# Patient Record
Sex: Female | Born: 1960 | Race: White | Hispanic: No | Smoking: Current every day smoker
Health system: Southern US, Community
[De-identification: ages and names within clinical notes are randomized; demographics above are authoritative.]

## PROBLEM LIST (undated history)

## (undated) DIAGNOSIS — C801 Malignant (primary) neoplasm, unspecified: Secondary | ICD-10-CM

## (undated) DIAGNOSIS — K219 Gastro-esophageal reflux disease without esophagitis: Secondary | ICD-10-CM

## (undated) DIAGNOSIS — J449 Chronic obstructive pulmonary disease, unspecified: Secondary | ICD-10-CM

## (undated) DIAGNOSIS — I10 Essential (primary) hypertension: Secondary | ICD-10-CM

## (undated) DIAGNOSIS — I639 Cerebral infarction, unspecified: Secondary | ICD-10-CM

## (undated) DIAGNOSIS — J45909 Unspecified asthma, uncomplicated: Secondary | ICD-10-CM

## (undated) DIAGNOSIS — J189 Pneumonia, unspecified organism: Secondary | ICD-10-CM

## (undated) HISTORY — PX: ABDOMINAL HYSTERECTOMY: SHX81

## (undated) HISTORY — PX: CHOLECYSTECTOMY: SHX55

## (undated) HISTORY — PX: TONSILLECTOMY: SUR1361

---

## 2013-03-18 ENCOUNTER — Emergency Department (HOSPITAL_BASED_OUTPATIENT_CLINIC_OR_DEPARTMENT_OTHER)
Admission: EM | Admit: 2013-03-18 | Discharge: 2013-03-18 | Disposition: A | Discharge status: Home / Self Care | Payer: Self-pay | Attending: Emergency Medicine | Admitting: Emergency Medicine

## 2013-03-18 ENCOUNTER — Encounter (HOSPITAL_BASED_OUTPATIENT_CLINIC_OR_DEPARTMENT_OTHER): Payer: Self-pay | Admitting: *Deleted

## 2013-03-18 ENCOUNTER — Emergency Department (HOSPITAL_BASED_OUTPATIENT_CLINIC_OR_DEPARTMENT_OTHER): Payer: Self-pay

## 2013-03-18 DIAGNOSIS — F17200 Nicotine dependence, unspecified, uncomplicated: Secondary | ICD-10-CM | POA: Insufficient documentation

## 2013-03-18 DIAGNOSIS — S22000A Wedge compression fracture of unspecified thoracic vertebra, initial encounter for closed fracture: Secondary | ICD-10-CM

## 2013-03-18 DIAGNOSIS — S22009A Unspecified fracture of unspecified thoracic vertebra, initial encounter for closed fracture: Secondary | ICD-10-CM | POA: Insufficient documentation

## 2013-03-18 DIAGNOSIS — Y939 Activity, unspecified: Secondary | ICD-10-CM | POA: Insufficient documentation

## 2013-03-18 DIAGNOSIS — W19XXXA Unspecified fall, initial encounter: Secondary | ICD-10-CM | POA: Insufficient documentation

## 2013-03-18 DIAGNOSIS — K219 Gastro-esophageal reflux disease without esophagitis: Secondary | ICD-10-CM | POA: Insufficient documentation

## 2013-03-18 DIAGNOSIS — Y929 Unspecified place or not applicable: Secondary | ICD-10-CM | POA: Insufficient documentation

## 2013-03-18 DIAGNOSIS — W1809XA Striking against other object with subsequent fall, initial encounter: Secondary | ICD-10-CM | POA: Insufficient documentation

## 2013-03-18 DIAGNOSIS — I10 Essential (primary) hypertension: Secondary | ICD-10-CM | POA: Insufficient documentation

## 2013-03-18 HISTORY — DX: Essential (primary) hypertension: I10

## 2013-03-18 HISTORY — DX: Gastro-esophageal reflux disease without esophagitis: K21.9

## 2013-03-18 MED ORDER — OXYCODONE-ACETAMINOPHEN 5-325 MG PO TABS: 2.0000 | ORAL_TABLET | ORAL | Status: DC | PRN

## 2013-03-18 MED ORDER — IBUPROFEN 800 MG PO TABS: 800.0000 mg | ORAL_TABLET | Freq: Three times a day (TID) | ORAL | Status: DC

## 2013-03-18 NOTE — ED Provider Notes (Signed)
History     CSN: [id number]  Arrival date & time 03/18/13  1301   First MD Initiated Contact with Patient 03/18/13 1416      Chief Complaint  Patient presents with  . Back Pain    (Consider location/radiation/quality/duration/timing/severity/associated sxs/prior treatment) Patient is a 52 y.o. female presenting with back pain.  Back Pain Location:  Lumbar spine Quality:  Aching Radiates to:  Does not radiate Pain severity:  Severe Pain is:  Same all the time Onset quality:  Sudden Duration:  7 days Timing:  Constant Progression:  Worsening Chronicity:  New Relieved by:  Nothing Worsened by:  Bending Ineffective treatments:  None tried Associated symptoms: no abdominal pain   Pt fell a week ago and hit low back.  Pt complains of continued pain. Pt reports no relief with patches for back pain   Pt reports she landed on low back.    Past Medical History  Diagnosis Date  . Hypertension   . GERD (gastroesophageal reflux disease)     Past Surgical History  Procedure Laterality Date  . Cholecystectomy    . Abdominal hysterectomy    . Tonsillectomy      History reviewed. No pertinent family history.  History  Substance Use Topics  . Smoking status: Current Every Day Smoker -- 1.00 packs/day    Types: Cigarettes  . Smokeless tobacco: Not on file  . Alcohol Use: No    OB History   Grav Para Term Preterm Abortions TAB SAB Ect Mult Living                  Review of Systems  Gastrointestinal: Negative for abdominal pain.  Musculoskeletal: Positive for back pain.  All other systems reviewed and are negative.    Allergies  Codeine  Home Medications   Current Outpatient Rx  Name  Route  Sig  Dispense  Refill  . omeprazole (PRILOSEC) 20 MG capsule   Oral   Take 20 mg by mouth daily.           BP 138/83  Pulse 95  Temp(Src) 97.8 F (36.6 C) (Oral)  Resp 20  Ht 5\' 1"  (1.549 m)  Wt 180 lb (81.647 kg)  BMI 34.03 kg/m2  SpO2 98%  Physical Exam   Nursing note and vitals reviewed. Constitutional: She is oriented to person, place, and time. She appears well-developed and well-nourished.  HENT:  Head: Normocephalic.  Eyes: Pupils are equal, round, and reactive to light.  Cardiovascular: Normal rate.   Pulmonary/Chest: Effort normal and breath sounds normal.  Abdominal: Soft. Bowel sounds are normal.  Musculoskeletal: Normal range of motion.  Neurological: She is alert and oriented to person, place, and time.  Tender ls spine diffusely greatest in upper lumbar area  Skin: Skin is warm.  Psychiatric: She has a normal mood and affect.    ED Course  Procedures (including critical care time)  Labs Reviewed - No data to display Dg Lumbar Spine Complete  03/18/2013  *RADIOLOGY REPORT*  Clinical Data: Larey Seat last weekend with low back pain radiating to the right leg  LUMBAR SPINE - COMPLETE 4+ VIEW  Comparison: None.  Findings: The lumbar vertebrae are in normal alignment.  There is very mild retrolisthesis of L3 on L4 by approximately 5 mm most likely degenerative in origin.  Degenerative disc disease is present at L5-S1.  However, there is partial compression deformity of the anterior superior aspect of T12 vertebral body most likely representing an acute or subacute compression  deformity. Degenerative change is noted the SI joints.  IMPRESSION:  1.  Probable acute or subacute partial compression deformity of the anterior superior aspect of T12 vertebral body.  No retropulsion. 2.  Degenerative disc disease at L5-S1. 3.  5 mm retrolisthesis of L3 on L4 most likely degenerative in origin.   Original Report Authenticated By: Dwyane Dee, M.D.   .edths   1. Thoracic compression fracture, closed, initial encounter       MDM  Pt counseled on results,  Pt advised of need for follow up.   Pt given rx for ibuprofen and percocet.   Pt does not want to miss work.  I advised no lifting.   Pt given number for Neurosurgery.        Lonia Skinner  [place], PA-C 03/18/13 2139

## 2013-03-18 NOTE — ED Notes (Signed)
Pt c/o fall x 7 days ago with lower back pain which radiates down left leg

## 2013-03-19 NOTE — ED Provider Notes (Signed)
Medical screening examination/treatment/procedure(s) were performed by non-physician practitioner and as supervising physician I was immediately available for consultation/collaboration.   Carleene Cooper III, MD 03/19/13 [phone]

## 2015-11-12 ENCOUNTER — Encounter: Payer: Self-pay | Admitting: Emergency Medicine

## 2015-11-12 ENCOUNTER — Emergency Department: Payer: Self-pay

## 2015-11-12 ENCOUNTER — Emergency Department
Admission: EM | Admit: 2015-11-12 | Discharge: 2015-11-12 | Disposition: A | Discharge status: Home / Self Care | Payer: Self-pay | Attending: Emergency Medicine | Admitting: Emergency Medicine

## 2015-11-12 ENCOUNTER — Other Ambulatory Visit: Payer: Self-pay

## 2015-11-12 DIAGNOSIS — C3490 Malignant neoplasm of unspecified part of unspecified bronchus or lung: Secondary | ICD-10-CM

## 2015-11-12 DIAGNOSIS — J159 Unspecified bacterial pneumonia: Secondary | ICD-10-CM | POA: Insufficient documentation

## 2015-11-12 DIAGNOSIS — F17210 Nicotine dependence, cigarettes, uncomplicated: Secondary | ICD-10-CM | POA: Insufficient documentation

## 2015-11-12 DIAGNOSIS — J189 Pneumonia, unspecified organism: Secondary | ICD-10-CM

## 2015-11-12 DIAGNOSIS — R079 Chest pain, unspecified: Secondary | ICD-10-CM

## 2015-11-12 DIAGNOSIS — I10 Essential (primary) hypertension: Secondary | ICD-10-CM | POA: Insufficient documentation

## 2015-11-12 DIAGNOSIS — R0600 Dyspnea, unspecified: Secondary | ICD-10-CM

## 2015-11-12 DIAGNOSIS — Z79899 Other long term (current) drug therapy: Secondary | ICD-10-CM | POA: Insufficient documentation

## 2015-11-12 DIAGNOSIS — R000 Tachycardia, unspecified: Secondary | ICD-10-CM | POA: Insufficient documentation

## 2015-11-12 LAB — BASIC METABOLIC PANEL
Anion gap: 12 (ref 5–15)
BUN: 7 mg/dL (ref 6–20)
CHLORIDE: 100 mmol/L — AB (ref 101–111)
CO2: 23 mmol/L (ref 22–32)
CREATININE: 0.68 mg/dL (ref 0.44–1.00)
Calcium: 10 mg/dL (ref 8.9–10.3)
Glucose, Bld: 97 mg/dL (ref 65–99)
POTASSIUM: 4.6 mmol/L (ref 3.5–5.1)
Sodium: 135 mmol/L (ref 135–145)

## 2015-11-12 LAB — CBC
HEMATOCRIT: 46.4 % (ref 35.0–47.0)
Hemoglobin: 15.6 g/dL (ref 12.0–16.0)
MCH: 33.6 pg (ref 26.0–34.0)
MCHC: 33.5 g/dL (ref 32.0–36.0)
MCV: 100.2 fL — AB (ref 80.0–100.0)
PLATELETS: 155 10*3/uL (ref 150–440)
RBC: 4.63 MIL/uL (ref 3.80–5.20)
RDW: 12.2 % (ref 11.5–14.5)
WBC: 9.3 10*3/uL (ref 3.6–11.0)

## 2015-11-12 LAB — URINALYSIS COMPLETE WITH MICROSCOPIC (ARMC ONLY)
Bilirubin Urine: NEGATIVE
GLUCOSE, UA: NEGATIVE mg/dL
Leukocytes, UA: NEGATIVE
Nitrite: NEGATIVE
PH: 5 (ref 5.0–8.0)
PROTEIN: NEGATIVE mg/dL
Specific Gravity, Urine: 1.012 (ref 1.005–1.030)

## 2015-11-12 LAB — TROPONIN I: Troponin I: <0.03 ng/mL (ref <0.031)

## 2015-11-12 MED ORDER — ONDANSETRON HCL 4 MG/2ML IJ SOLN
4.0000 mg | Freq: Once | INTRAMUSCULAR | Status: AC
  Administered 2015-11-12: 4 mg via INTRAVENOUS
  Filled 2015-11-12: qty 2

## 2015-11-12 MED ORDER — CEPHALEXIN 500 MG PO CAPS: 500.0000 mg | ORAL_CAPSULE | Freq: Three times a day (TID) | ORAL | Status: AC

## 2015-11-12 MED ORDER — MORPHINE SULFATE (PF) 4 MG/ML IV SOLN
4.0000 mg | Freq: Once | INTRAVENOUS | Status: AC
  Administered 2015-11-12: 4 mg via INTRAVENOUS
  Filled 2015-11-12: qty 1

## 2015-11-12 MED ORDER — OXYCODONE-ACETAMINOPHEN 5-325 MG PO TABS: 2.0000 | ORAL_TABLET | ORAL | Status: DC | PRN

## 2015-11-12 MED ORDER — IBUPROFEN 100 MG/5ML PO SUSP
ORAL | Status: AC
  Filled 2015-11-12: qty 5

## 2015-11-12 MED ORDER — IOHEXOL 350 MG/ML SOLN
75.0000 mL | Freq: Once | INTRAVENOUS | Status: AC | PRN
  Administered 2015-11-12: 75 mL via INTRAVENOUS

## 2015-11-12 NOTE — ED Notes (Signed)
Patient returned from [facility]. Placed back on monitor. Family at bedside.

## 2015-11-12 NOTE — ED Provider Notes (Signed)
  Physical Exam  BP 158/93 mmHg  Pulse 101  Temp(Src) 97.4 F (36.3 C) (Oral)  Resp 18  Ht '5\' 1"'$  (1.549 m)  Wt 150 lb (68.04 kg)  BMI 28.36 kg/m2  SpO2 95%  Physical Exam  ED Course  Procedures  MDM Patient care assumed at sign out from Dr. Cinda Quest. Patient came here with cough and recently treated for pneumonia with minocycline. Afebrile, slightly tachy in the ED. Never hypoxic. Has pleuritic chest pain so CT angio ordered. 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 Patient does have pain but afebrile. Labs unremarkable. Called Dr. Grayland Ormond from oncology, who will call patient tomorrow to arrange biopsy and PET scan. She has no insurance. I wanted to prescribe levaquin but she can't afford it so will prescribe keflex instead.   Wandra Arthurs, MD 11/12/15 [phone]

## 2015-11-12 NOTE — ED Notes (Signed)
Pt presents to ER with chest pain with left breast pain . Pt states "it can be hard to breath at times". Pt has nodules below left breast with tenderness on palpation.

## 2015-11-12 NOTE — Discharge Instructions (Signed)
Dr. Gary Fleet office will call you tomorrow to arrange for workup for your cancer.   Take keflex as prescribed.   Take percocet for pain.  See Dr. Grayland Ormond this week.   Return to ER if you have worse shortness of breath, chest pain, fever, vomiting.

## 2015-11-12 NOTE — ED Provider Notes (Signed)
[facility] Emergency Department Provider Note  ____________________________________________  Time seen: Approximately 2:47 PM  I have reviewed the triage vital signs and the nursing notes.   HISTORY  Chief Complaint Chest Pain and Breast Pain    HPI Bethany Hood is a 54 y.o. female reports 3-4 weeks ago she went to clinic because she had some cough and was diagnosed with pneumonia been getting better but then about 3 or 4 days ago began having shortness of breath and pain with respiration or movement in the left side. No fever cough is productive of very small amounts of whitish thin phlegm occasionally. Pain is worse with deep breathing or movement. He does smoke. Pain is severe. Sharp achy stabbing with deep breathing   Past Medical History  Diagnosis Date  . Hypertension   . GERD (gastroesophageal reflux disease)     There are no active problems to display for this patient.   Past Surgical History  Procedure Laterality Date  . Cholecystectomy    . Abdominal hysterectomy    . Tonsillectomy      Current Outpatient Rx  Name  Route  Sig  Dispense  Refill  . ibuprofen (ADVIL,MOTRIN) 800 MG tablet   Oral   Take 1 tablet (800 mg total) by mouth 3 (three) times daily.   21 tablet   0   . omeprazole (PRILOSEC) 20 MG capsule   Oral   Take 20 mg by mouth daily.         Marland Kitchen oxyCODONE-acetaminophen (PERCOCET/ROXICET) 5-325 MG per tablet   Oral   Take 2 tablets by mouth every 4 (four) hours as needed for pain.   30 tablet   0     Allergies Codeine  History reviewed. No pertinent family history.  Social History Social History  Substance Use Topics  . Smoking status: Current Every Day Smoker -- 1.00 packs/day    Types: Cigarettes  . Smokeless tobacco: None  . Alcohol Use: No    Review of Systems Constitutional: No fever/chills Eyes: No visual changes. ENT: No sore throat. Cardiovascular: See history of present  illness Respiratory: See history of present illness. Gastrointestinal: No abdominal pain.  No nausea, no vomiting.  No diarrhea.  No constipation. Genitourinary: Negative for dysuria. Musculoskeletal: Negative for back pain. Skin: Negative for rash. Neurological: Negative for headaches, focal weakness or numbness.  10-point ROS otherwise negative.  ____________________________________________   PHYSICAL EXAM:  VITAL SIGNS: ED Triage Vitals  Enc Vitals Group     BP 11/12/15 1142 140/82 mmHg     Pulse Rate 11/12/15 1142 117     Resp 11/12/15 1142 18     Temp 11/12/15 1142 97.4 F (36.3 C)     Temp Source 11/12/15 1142 Oral     SpO2 11/12/15 1142 96 %     Weight 11/12/15 1142 150 lb (68.04 kg)     Height 11/12/15 1142 '5\' 1"'$  (1.549 m)     Head Cir --      Peak Flow --      Pain Score 11/12/15 1143 8     Pain Loc --      Pain Edu? --      Excl. in [place]? --    Constitutional: Alert and oriented. In pain. Eyes: Conjunctivae are normal. PERRL. EOMI. Head: Atraumatic. Nose: No congestion/rhinnorhea. Mouth/Throat: Mucous membranes are moist.  Oropharynx non-erythematous. Neck: No stridor. Cardiovascular: Tachycardic regular rhythm. Grossly normal heart sounds.  Good peripheral circulation. Respiratory: Normal respiratory effort.  No retractions. Lungs CTAB. She complains of pain with deep breathing Chest: Patient has approximately a 1 cm nodule under the left breast which is tender but mobile. Patient also complains of point pain in the breast is worried about having breast cancer. I summoned the nurse to ask my chaperone and examined the breast do not feel any discrete breast masses at all there is no nipple discharge breast itself looks normal. The breast itself is not really tender on exam either. Gastrointestinal: Soft and nontender. No distention. No abdominal bruits. No CVA tenderness. Musculoskeletal: No lower extremity tenderness nor edema.  No joint effusions. Neurologic:   Normal speech and language. No gross focal neurologic deficits are appreciated. No gait instability. Skin:  Skin is warm, dry and intact. No rash noted.   ____________________________________________   LABS (all labs ordered are listed, but only abnormal results are displayed)  Labs Reviewed  BASIC METABOLIC PANEL - Abnormal; Notable for the following:    Chloride 100 (*)    All other components within normal limits  CBC - Abnormal; Notable for the following:    MCV 100.2 (*)    All other components within normal limits  TROPONIN I  URINALYSIS COMPLETEWITH MICROSCOPIC ([facility] ONLY)   ____________________________________________  EKG  EKG read and interpreted by me shows sinus tachycardia rate of 106 there is decreased R wave progression which may be due to lead placement. There are no acute ST-T wave changes. ____________________________________________  RADIOLOGY   ____________________________________________   PROCEDURES    ____________________________________________   INITIAL IMPRESSION / ASSESSMENT AND PLAN / ED COURSE  Pertinent labs & imaging results that were available during my care of the patient were reviewed by me and considered in my medical decision making (see chart for details). CT is pending we'll sign the patient to Dr. Darl Householder  ____________________________________________   FINAL CLINICAL IMPRESSION(S) / ED DIAGNOSES  Final diagnoses:  Chest pain, unspecified chest pain type  Dyspnea      Nena Polio, MD 11/12/15 1546

## 2015-11-15 ENCOUNTER — Inpatient Hospital Stay: Payer: Self-pay

## 2015-11-15 ENCOUNTER — Telehealth: Payer: Self-pay

## 2015-11-15 ENCOUNTER — Encounter: Payer: Self-pay | Admitting: Oncology

## 2015-11-15 ENCOUNTER — Inpatient Hospital Stay: Payer: Self-pay | Attending: Oncology | Admitting: Oncology

## 2015-11-15 VITALS — BP 126/89 | HR 98 | Temp 98.1°F | Resp 20 | Ht 62.8 in | Wt 158.5 lb

## 2015-11-15 DIAGNOSIS — Z79899 Other long term (current) drug therapy: Secondary | ICD-10-CM | POA: Insufficient documentation

## 2015-11-15 DIAGNOSIS — Z801 Family history of malignant neoplasm of trachea, bronchus and lung: Secondary | ICD-10-CM | POA: Insufficient documentation

## 2015-11-15 DIAGNOSIS — R05 Cough: Secondary | ICD-10-CM | POA: Insufficient documentation

## 2015-11-15 DIAGNOSIS — R918 Other nonspecific abnormal finding of lung field: Secondary | ICD-10-CM | POA: Insufficient documentation

## 2015-11-15 DIAGNOSIS — R5381 Other malaise: Secondary | ICD-10-CM | POA: Insufficient documentation

## 2015-11-15 DIAGNOSIS — J449 Chronic obstructive pulmonary disease, unspecified: Secondary | ICD-10-CM | POA: Insufficient documentation

## 2015-11-15 DIAGNOSIS — J45909 Unspecified asthma, uncomplicated: Secondary | ICD-10-CM | POA: Insufficient documentation

## 2015-11-15 DIAGNOSIS — Z808 Family history of malignant neoplasm of other organs or systems: Secondary | ICD-10-CM | POA: Insufficient documentation

## 2015-11-15 DIAGNOSIS — R0602 Shortness of breath: Secondary | ICD-10-CM | POA: Insufficient documentation

## 2015-11-15 DIAGNOSIS — R531 Weakness: Secondary | ICD-10-CM | POA: Insufficient documentation

## 2015-11-15 DIAGNOSIS — C3491 Malignant neoplasm of unspecified part of right bronchus or lung: Secondary | ICD-10-CM

## 2015-11-15 DIAGNOSIS — H9211 Otorrhea, right ear: Secondary | ICD-10-CM | POA: Insufficient documentation

## 2015-11-15 DIAGNOSIS — K769 Liver disease, unspecified: Secondary | ICD-10-CM | POA: Insufficient documentation

## 2015-11-15 DIAGNOSIS — R5383 Other fatigue: Secondary | ICD-10-CM | POA: Insufficient documentation

## 2015-11-15 DIAGNOSIS — R062 Wheezing: Secondary | ICD-10-CM | POA: Insufficient documentation

## 2015-11-15 DIAGNOSIS — I10 Essential (primary) hypertension: Secondary | ICD-10-CM | POA: Insufficient documentation

## 2015-11-15 DIAGNOSIS — F17210 Nicotine dependence, cigarettes, uncomplicated: Secondary | ICD-10-CM | POA: Insufficient documentation

## 2015-11-15 DIAGNOSIS — K219 Gastro-esophageal reflux disease without esophagitis: Secondary | ICD-10-CM | POA: Insufficient documentation

## 2015-11-15 DIAGNOSIS — R079 Chest pain, unspecified: Secondary | ICD-10-CM | POA: Insufficient documentation

## 2015-11-15 DIAGNOSIS — Z8051 Family history of malignant neoplasm of kidney: Secondary | ICD-10-CM | POA: Insufficient documentation

## 2015-11-15 LAB — COMPREHENSIVE METABOLIC PANEL
ALK PHOS: 107 U/L (ref 38–126)
ALT: 32 U/L (ref 14–54)
AST: 33 U/L (ref 15–41)
Albumin: 3.9 g/dL (ref 3.5–5.0)
Anion gap: 10 (ref 5–15)
BILIRUBIN TOTAL: 0.3 mg/dL (ref 0.3–1.2)
BUN: 8 mg/dL (ref 6–20)
CALCIUM: 9.5 mg/dL (ref 8.9–10.3)
CO2: 26 mmol/L (ref 22–32)
CREATININE: 0.66 mg/dL (ref 0.44–1.00)
Chloride: 98 mmol/L — ABNORMAL LOW (ref 101–111)
Glucose, Bld: 103 mg/dL — ABNORMAL HIGH (ref 65–99)
Potassium: 4 mmol/L (ref 3.5–5.1)
SODIUM: 134 mmol/L — AB (ref 135–145)
Total Protein: 7.4 g/dL (ref 6.5–8.1)

## 2015-11-15 MED ORDER — OXYCODONE HCL 10 MG PO TABS: 10.0000 mg | ORAL_TABLET | Freq: Four times a day (QID) | ORAL | Status: DC | PRN

## 2015-11-15 MED ORDER — FENTANYL 25 MCG/HR TD PT72: 25.0000 ug | MEDICATED_PATCH | TRANSDERMAL | Status: DC

## 2015-11-15 NOTE — Telephone Encounter (Signed)
Forgot to mention at her appointment today that she is having constipation with no abdominal pain or cramping.  Advised her to try OTC Miralax and call office if no improvement or in pain occurs.

## 2015-11-15 NOTE — Progress Notes (Signed)
Patient presented to the ER 3 days ago with left side chest pain that she was having for 3-4 days.  The pain is still at 6/10 on pain scale with taking Oxycodone.

## 2015-11-16 ENCOUNTER — Telehealth: Payer: Self-pay | Admitting: Oncology

## 2015-11-16 ENCOUNTER — Other Ambulatory Visit: Payer: Self-pay | Admitting: *Deleted

## 2015-11-16 DIAGNOSIS — R918 Other nonspecific abnormal finding of lung field: Secondary | ICD-10-CM

## 2015-11-16 NOTE — Telephone Encounter (Signed)
She needs to talk to you about a specimen. Please call. Thanks.

## 2015-11-16 NOTE — Telephone Encounter (Signed)
Spoke with the main lab, specimens for APTT and PT/INR are not able to be processed. Patient scheduled for lab only appointment next week prior to biopsy.

## 2015-11-19 ENCOUNTER — Inpatient Hospital Stay: Payer: Self-pay

## 2015-11-19 ENCOUNTER — Ambulatory Visit
Admission: RE | Admit: 2015-11-19 | Discharge: 2015-11-19 | Disposition: A | Discharge status: Home / Self Care | Payer: Self-pay | Source: Ambulatory Visit | Attending: Oncology | Admitting: Oncology

## 2015-11-19 DIAGNOSIS — C3491 Malignant neoplasm of unspecified part of right bronchus or lung: Secondary | ICD-10-CM | POA: Insufficient documentation

## 2015-11-19 DIAGNOSIS — R918 Other nonspecific abnormal finding of lung field: Secondary | ICD-10-CM

## 2015-11-19 DIAGNOSIS — K760 Fatty (change of) liver, not elsewhere classified: Secondary | ICD-10-CM | POA: Insufficient documentation

## 2015-11-19 LAB — GLUCOSE, CAPILLARY: GLUCOSE-CAPILLARY: 123 mg/dL — AB (ref 65–99)

## 2015-11-19 LAB — PROTIME-INR
INR: 1.08
Prothrombin Time: 14.2 seconds (ref 11.4–15.0)

## 2015-11-19 LAB — APTT: APTT: 31 s (ref 24–36)

## 2015-11-19 MED ORDER — FLUDEOXYGLUCOSE F - 18 (FDG) INJECTION
12.8300 | Freq: Once | INTRAVENOUS | Status: AC | PRN
  Administered 2015-11-19: 12.83 via INTRAVENOUS

## 2015-11-20 ENCOUNTER — Other Ambulatory Visit: Payer: Self-pay | Admitting: Radiology

## 2015-11-20 ENCOUNTER — Other Ambulatory Visit: Payer: Self-pay | Admitting: Oncology

## 2015-11-20 ENCOUNTER — Other Ambulatory Visit: Payer: Self-pay

## 2015-11-20 DIAGNOSIS — R918 Other nonspecific abnormal finding of lung field: Secondary | ICD-10-CM | POA: Insufficient documentation

## 2015-11-20 NOTE — OR Nursing (Signed)
Pt arrived for CT which was scheduled for Friday Dec 23rd. She told registration she was not aware of procedure tomorrow. Dr Anselm Pancoast and I discussed case. Dr Anselm Pancoast feels she needs to be rescheduled to have biopsy in [place] of either Liver or Supraficial mass upper left abdmoninal mass. He will discuss with Dr Earleen Newport the IR md on schedule for tomorrow. Scheduling notified of change from ct lung bx to US biopsy of liver tomorrow at same time.

## 2015-11-21 ENCOUNTER — Ambulatory Visit
Admission: RE | Admit: 2015-11-21 | Discharge: 2015-11-21 | Disposition: A | Discharge status: Home / Self Care | Payer: Self-pay | Source: Ambulatory Visit | Attending: Oncology | Admitting: Oncology

## 2015-11-21 DIAGNOSIS — R918 Other nonspecific abnormal finding of lung field: Secondary | ICD-10-CM | POA: Insufficient documentation

## 2015-11-21 HISTORY — DX: Pneumonia, unspecified organism: J18.9

## 2015-11-21 HISTORY — DX: Malignant (primary) neoplasm, unspecified: C80.1

## 2015-11-21 HISTORY — DX: Chronic obstructive pulmonary disease, unspecified: J44.9

## 2015-11-21 HISTORY — DX: Cerebral infarction, unspecified: I63.9

## 2015-11-21 HISTORY — DX: Unspecified asthma, uncomplicated: J45.909

## 2015-11-21 NOTE — Progress Notes (Signed)
Interventional Radiology Progress Note  54 year old female with imaging diagnosis of lung cancer, with mets on PET-CT.  She is referred for a biopsy for tissue diagnosis.   Dr. Anselm Pancoast visited with the patient pre-op on Tuesday, 11/20/2015, and patient has URI symptoms without fever, potentially chronic bronchitis symptoms.  Tachypnea during the interview, and the patient was concerned about the liver biopsy.  Discussed with and agree with Dr. Anselm Pancoast.   Review of the imaging shows FDG+ lesion in the subcu tissues of the body wall, which is a reasonable and safe alternative for biopsy.  This will not require sedation.    Recommendations:  - Proceed with US guided biopsy of left chest wall lesion for pathologic diagnosis.  This will require only local anesthesia.   Signed,  Dulcy Fanny. Earleen Newport, DO

## 2015-11-21 NOTE — Progress Notes (Signed)
[place]  [phone] [phone]  ID: Bethany Hood OB: January 23, 1961  MR#: [id number]  TIW#:[id number]  Patient Care Team: No Pcp Per Patient as PCP - General (General Practice)  CHIEF COMPLAINT:  Chief Complaint  Patient presents with  . Lung Mass    INTERVAL HISTORY: Patient is a 54 year old female who presented to the emergency room several days ago complaining of left sided chest pain for 3-4 days. Subsequent workup included a CT scan which revealed a large lung mass as well as multiple lesions in her liver suspicious for metastatic disease. Currently, she is continuing to have pain that keeps her awake at night. She also has increased shortness of breath, cough, and wheezing. She denies hemoptysis. She has a fair appetite, but denies weight loss. She has no neurologic complaints. She denies any fevers. She has no nausea, vomiting, constipation, or diarrhea. She has no urinary complaints. Patient otherwise feels well and offers no further specific complaints.  REVIEW OF SYSTEMS:   Review of Systems  Constitutional: Positive for malaise/fatigue. Negative for fever and weight loss.  Respiratory: Positive for cough, shortness of breath and wheezing. Negative for hemoptysis.   Cardiovascular: Positive for chest pain. Negative for leg swelling.  Gastrointestinal: Negative.   Musculoskeletal: Negative.   Neurological: Positive for weakness.    As per HPI. Otherwise, a complete review of systems is negatve.  PAST MEDICAL HISTORY: Past Medical History  Diagnosis Date  . Hypertension   . GERD (gastroesophageal reflux disease)   . Cancer ([place])   . COPD (chronic obstructive pulmonary disease) ([place])   . Stroke ([place])   . Asthma   . Pneumonia     PAST SURGICAL HISTORY: Past Surgical History  Procedure Laterality Date  . Cholecystectomy    . Abdominal hysterectomy    . Tonsillectomy      FAMILY HISTORY Family History  Problem  Relation Age of Onset  . Kidney cancer Mother   . Liver cancer Brother   . Lung cancer Paternal Uncle        ADVANCED DIRECTIVES:    HEALTH MAINTENANCE: Social History  Substance Use Topics  . Smoking status: Current Every Day Smoker -- 1.00 packs/day for 28 years    Types: Cigarettes  . Smokeless tobacco: Never Used  . Alcohol Use: 3.6 oz/week    0 Standard drinks or equivalent, 6 Cans of beer per week     Colonoscopy:  PAP:  Bone density:  Lipid panel:  Allergies  Allergen Reactions  . Codeine Nausea Only    Nausea if taken on an empty stomach    Current Outpatient Prescriptions  Medication Sig Dispense Refill  . cephALEXin (KEFLEX) 500 MG capsule Take 1 capsule (500 mg total) by mouth 3 (three) times daily. 30 capsule 0  . ibuprofen (ADVIL,MOTRIN) 800 MG tablet Take 1 tablet (800 mg total) by mouth 3 (three) times daily. 21 tablet 0  . omeprazole (PRILOSEC) 20 MG capsule Take 20 mg by mouth daily.    . fentaNYL (DURAGESIC - DOSED MCG/HR) 25 MCG/HR patch Place 1 patch (25 mcg total) onto the skin every 3 (three) days. 10 patch 0  . Oxycodone HCl 10 MG TABS Take 1 tablet (10 mg total) by mouth every 6 (six) hours as needed. 60 tablet 0   No current facility-administered medications for this visit.    OBJECTIVE: Filed Vitals:   11/15/15 1027  BP: 126/89  Pulse: 98  Temp: 98.1 F (36.7 C)  Resp: 20  Body mass index is 28.26 kg/(m^2).    ECOG FS:0 - Asymptomatic  General: Well-developed, well-nourished, no acute distress. Eyes: Pink conjunctiva, anicteric sclera. HEENT: Normocephalic, moist mucous membranes, clear oropharnyx. Lungs: Scattered wheezing throughout.  Heart: Regular rate and rhythm. No rubs, murmurs, or gallops. Abdomen: Soft, nontender, nondistended. No organomegaly noted, normoactive bowel sounds. Musculoskeletal: No edema, cyanosis, or clubbing. Neuro: Alert, answering all questions appropriately. Cranial nerves grossly intact. Skin: No  rashes or petechiae noted. Psych: Normal affect. Lymphatics: No cervical, calvicular, axillary or inguinal LAD.   LAB RESULTS:  Lab Results  Component Value Date   NA 134* 11/15/2015   K 4.0 11/15/2015   CL 98* 11/15/2015   CO2 26 11/15/2015   GLUCOSE 103* 11/15/2015   BUN 8 11/15/2015   CREATININE 0.66 11/15/2015   CALCIUM 9.5 11/15/2015   PROT 7.4 11/15/2015   ALBUMIN 3.9 11/15/2015   AST 33 11/15/2015   ALT 32 11/15/2015   ALKPHOS 107 11/15/2015   BILITOT 0.3 11/15/2015   GFRNONAA >60 11/15/2015   GFRAA >60 11/15/2015    Lab Results  Component Value Date   WBC 9.3 11/12/2015   HGB 15.6 11/12/2015   HCT 46.4 11/12/2015   MCV 100.2* 11/12/2015   PLT 155 11/12/2015     STUDIES: Dg Chest 2 View  11/12/2015  CLINICAL DATA:  54 year old female with chest pain and left breast pain for the past 3 days. Difficulty breathing. EXAM: CHEST  2 VIEW COMPARISON:  No priors. FINDINGS: Fullness in the right infrahilar region, partially obscuring the right heart border on the frontal projection and projecting over the heart on the lateral projection, concerning for right middle lobe pneumonia. Left lung is clear. No pleural effusions. No pneumothorax the No evidence of pulmonary edema. Heart size is normal. Upper mediastinal contours are otherwise within normal limits. IMPRESSION: 1. Findings likely reflect right middle lobe pneumonia. Followup PA and lateral chest X-ray is recommended in 3-4 weeks following trial of antibiotic therapy to ensure resolution and exclude underlying malignancy. Electronically Signed   By: Vinnie Langton M.D.   On: 11/12/2015 12:34   Ct Angio Chest Pe W/cm &/or Wo Cm  11/12/2015  CLINICAL DATA:  54 year old female with history of cough for the past 3-4 weeks, worsening acutely over the past 3-4 days, now with shortness of breath and pain during respiration. Pain is worse during deep breathing and movement. EXAM: CT ANGIOGRAPHY CHEST WITH CONTRAST TECHNIQUE:  Multidetector CT imaging of the chest was performed using the standard protocol during bolus administration of intravenous contrast. Multiplanar CT image reconstructions and MIPs were obtained to evaluate the vascular anatomy. CONTRAST:  51m OMNIPAQUE IOHEXOL 350 MG/ML SOLN COMPARISON:  No priors. FINDINGS: Mediastinum/Lymph Nodes: There are no filling defects within the pulmonary arterial tree to suggest pulmonary embolism. There is extensive lymphadenopathy in the right hilar region and throughout the mediastinum bilaterally. Specific examples include a bulky conglomerate nodal mass in the right hilar region measuring approximately 3.4 x 3.1 cm (image 67 of series 4), subcarinal node measuring 2.4 cm in short axis, low right paratracheal lymph node measuring 20 mm in short axis, low left paratracheal lymph node measuring 12 mm in short axis, prevascular lymph nodes measuring up to 17 mm in short axis, and a superior mediastinal lymph node adjacent to the proximal left common carotid artery measuring 11 mm in short axis. No left hilar adenopathy is noted at this time. Heart size is normal. Small amount of pericardial fluid and/or thickening anteriorly,  unlikely to be of any hemodynamic significance at this time. No associated pericardial calcification. Atherosclerosis in the thoracic aorta and great vessels of the mediastinum. Esophagus is unremarkable in appearance. No axillary lymphadenopathy. Lungs/Pleura: In the central aspect of the right middle lobe there is a mass-like opacity measuring 2.6 x 3.5 cm (image 77 of series 6), suspicious for potential primary bronchogenic neoplasm. Distal to this there are what appear to be postobstructive changes in the right middle lobe, most evident in the lateral segment. Septal thickening and patchy nodularity is noted throughout much of the right lung, particularly in the right middle and upper lobes. The largest nodules include a 6 mm right upper lobe nodule (image 43 of  series 6), and a 6 mm right lower lobe nodule (image 66 of series 6). There is a generalized pattern of centrilobular ground-glass attenuation micronodularity in both lungs which is most prominent in the upper lungs, likely to reflect a smoking related disease such as respiratory bronchiolitis, or respiratory bronchiolitis interstitial lung disease. No pleural effusions. Upper Abdomen: Diffuse low attenuation throughout the hepatic parenchyma, compatible with a background of severe hepatic steatosis. There are multifocal hypovascular hepatic lesions, highly concerning for metastatic disease, the largest of which is a 5.9 x 4.5 cm mass-like area in the central aspect of the right lobe of the liver predominantly involving segment 8 (image 96 of series 4). Multiple borderline enlarged and mildly enlarged lymph nodes in the upper abdomen, largest which measures 1.2 cm in the gastrohepatic ligament. Musculoskeletal/Soft Tissues: Nondisplaced fracture of the left first rib. Old compression fracture of T12 with approximately 20% loss of anterior vertebral body height. There are no aggressive appearing lytic or blastic lesions noted in the visualized portions of the skeleton. Review of the MIP images confirms the above findings. IMPRESSION: 1. No evidence of pulmonary embolism. 2. Importantly, there is evidence of probable primary bronchogenic malignancy in the right middle lobe with associated lymphangitic spread of tumor in the right lung, right hilar and bilateral mediastinal lymphadenopathy, and multiple metastatic lesions throughout the liver with some upper abdominal lymphadenopathy, as above, highly concerning for stage IV lung cancer. Further evaluation with PET-CT and/or biopsy is recommended in the near future for diagnostic and staging purposes. 3. Pattern of diffuse centrilobular ground-glass attenuation micro nodularity in the lungs bilaterally, suggestive of a smoking related disease such as respiratory  bronchiolitis or respiratory bronchiolitis interstitial lung disease. 4. Atherosclerosis. Electronically Signed   By: Vinnie Langton M.D.   On: 11/12/2015 16:29   Nm Pet Image Initial (pi) Skull Base To Thigh  11/19/2015  CLINICAL DATA:  Initial treatment strategy for lung cancer. EXAM: NUCLEAR MEDICINE PET SKULL BASE TO THIGH TECHNIQUE: 12.8 mCi F-18 FDG was injected intravenously. Full-ring PET imaging was performed from the skull base to thigh after the radiotracer. CT data was obtained and used for attenuation correction and anatomic localization. FASTING BLOOD GLUCOSE:  Value: 123 mg/dl COMPARISON:  CT chest 11/12/2015. FINDINGS: NECK No hypermetabolic lymph nodes in the neck. CT images show no acute findings. CHEST Hypermetabolic bulky mediastinal adenopathy. Index mass in the lower right paratracheal station measures approximately 2.1 cm (CT image 81) with an SUV max of 15.4. Right hilar mass is difficult to measure without IV contrast and has an SUV max of 14.7. Postobstructive pneumonitis in the right middle lobe is hypermetabolic. There are scattered pulmonary nodules in the right lung, sub cm in size, some of which hypermetabolic. Index nodule in the right lower lobe measures approximately  10 mm (CT image 115) with an SUV max of 3.3. No pericardial or pleural effusion. ABDOMEN/PELVIS Numerous hypermetabolic lesions are seen within the liver, poorly visualized on CT, possibly due to diffuse low-attenuation. Index lesion in the dome of the liver has an SUV max of 20.5. Lymph node adjacent to the distal stomach measures 1.4 cm (CT image 134) with an SUV max of 13.2. Scattered omental nodules measure up to 1.3 cm in the left abdomen (CT image 181), with an SUV max of 10.3. Left common iliac lymph node measures approximately 1.3 cm (image 196) with an SUV max of 11.7. Finally, there are scattered hypermetabolic subcutaneous nodules along the left abdominal wall and posterior to the left hip. CT images  show no acute findings. SKELETON Numerous hypermetabolic lesions are seen in the left frontal bone, right humerus, ribs, spine, sternum, right acetabulum and proximal left femur. IMPRESSION: 1. Stage IV primary bronchogenic carcinoma as evidenced by hypermetabolic right hilar mass, mediastinal/abdominal/pelvic adenopathy, right lung nodules, hepatic lesions, omental nodules, soft tissue nodules and osseous lesions. 2. Hepatic steatosis. Electronically Signed   By: Lorin Picket M.D.   On: 11/19/2015 14:14    ASSESSMENT: Right lung mass with multiple liver lesions suspicious for metastatic lung cancer.  PLAN:    1. Lung mass: CT scan results reviewed independently and reported as above. This is highly suspicious for lung primary with metastatic lesions in her liver. PET scan can results reported as above. Patient will require biopsy to confirm the diagnosis. Case has been discussed with interventional radiology and have recommended an ultrasound-guided biopsy of one of her liver lesions. Patient will also require an MRI of her brain to complete staging workup. She has been instructed to return to clinic approximately one week after her biopsy to discuss the results and treatment planning. 2. Pain: Patient was given a prescription for fentanyl patch as well as a refill of oxicodone.  Proximal 45 minutes was spent in discussion of which greater than 50% was consultation.  Patient expressed understanding and was in agreement with this plan. She also understands that She can call clinic at any time with any questions, concerns, or complaints.    Lloyd Huger, MD   11/21/2015 9:34 AM

## 2015-11-21 NOTE — Procedures (Signed)
Interventional Radiology Procedure Note  Procedure: Image guided biopsy of left chest wall/subcutaneous nodule which is FDG+ on PET-CT.  3 x 18G core biopsy.  Complications: None Recommendations:  - Ok to shower tomorrow - Do not submerge for 7 days - Routine care  - follow up pathology   Signed,  Dulcy Fanny. Earleen Newport, DO

## 2015-11-22 ENCOUNTER — Other Ambulatory Visit: Payer: Self-pay | Admitting: *Deleted

## 2015-11-22 MED ORDER — OXYCODONE HCL 10 MG PO TABS: 10.0000 mg | ORAL_TABLET | Freq: Four times a day (QID) | ORAL | Status: DC | PRN

## 2015-11-22 NOTE — Telephone Encounter (Signed)
Per Dr Grayland Ormond needs to know if she is using her fentanyl patch. I called patient who states she does have a fentanyl patch on and that vomited the other night. I also discussed the fact that she is using more med than prescribed. She reports that she has been taking it every 3 hours because she is hurting.

## 2015-11-22 NOTE — Telephone Encounter (Signed)
Per Dr Grayland Ormond fill #28 tabs and use as directed. If she runs out early, will not refill. Discussed this with patient who states she will follow directions and also use her patch as directed

## 2015-11-22 NOTE — Telephone Encounter (Signed)
Requesting refill on Oxycodone which she got filled #[id number] on 12/15 and directions are every 6 hours so refill not due until 12/29. States she is completely out of it.

## 2015-11-23 ENCOUNTER — Ambulatory Visit
Admission: RE | Admit: 2015-11-23 | Discharge: 2015-11-23 | Disposition: A | Discharge status: Home / Self Care | Payer: Self-pay | Source: Ambulatory Visit | Attending: Oncology | Admitting: Oncology

## 2015-11-23 DIAGNOSIS — R42 Dizziness and giddiness: Secondary | ICD-10-CM | POA: Insufficient documentation

## 2015-11-23 DIAGNOSIS — C3491 Malignant neoplasm of unspecified part of right bronchus or lung: Secondary | ICD-10-CM | POA: Insufficient documentation

## 2015-11-23 LAB — SURGICAL PATHOLOGY

## 2015-11-23 MED ORDER — IOHEXOL 300 MG/ML  SOLN
75.0000 mL | Freq: Once | INTRAMUSCULAR | Status: AC | PRN
  Administered 2015-11-23: 75 mL via INTRAVENOUS

## 2015-11-27 ENCOUNTER — Telehealth: Payer: Self-pay | Admitting: *Deleted

## 2015-11-27 NOTE — Telephone Encounter (Signed)
Ok, that will be fine.

## 2015-11-27 NOTE — Telephone Encounter (Signed)
Called asking to be made hospice

## 2015-11-27 NOTE — Telephone Encounter (Signed)
Hospice orders faxed 

## 2015-11-29 ENCOUNTER — Inpatient Hospital Stay (HOSPITAL_BASED_OUTPATIENT_CLINIC_OR_DEPARTMENT_OTHER): Payer: Self-pay | Admitting: Oncology

## 2015-11-29 VITALS — BP 141/79 | HR 108 | Temp 98.8°F | Resp 18 | Wt 154.5 lb

## 2015-11-29 DIAGNOSIS — R05 Cough: Secondary | ICD-10-CM

## 2015-11-29 DIAGNOSIS — R079 Chest pain, unspecified: Secondary | ICD-10-CM

## 2015-11-29 DIAGNOSIS — R5381 Other malaise: Secondary | ICD-10-CM

## 2015-11-29 DIAGNOSIS — R5383 Other fatigue: Secondary | ICD-10-CM

## 2015-11-29 DIAGNOSIS — H9211 Otorrhea, right ear: Secondary | ICD-10-CM

## 2015-11-29 DIAGNOSIS — C3401 Malignant neoplasm of right main bronchus: Secondary | ICD-10-CM

## 2015-11-29 DIAGNOSIS — R0602 Shortness of breath: Secondary | ICD-10-CM

## 2015-11-29 DIAGNOSIS — K769 Liver disease, unspecified: Secondary | ICD-10-CM

## 2015-11-29 DIAGNOSIS — J449 Chronic obstructive pulmonary disease, unspecified: Secondary | ICD-10-CM

## 2015-11-29 DIAGNOSIS — F17210 Nicotine dependence, cigarettes, uncomplicated: Secondary | ICD-10-CM

## 2015-11-29 DIAGNOSIS — C3491 Malignant neoplasm of unspecified part of right bronchus or lung: Secondary | ICD-10-CM

## 2015-11-29 DIAGNOSIS — R062 Wheezing: Secondary | ICD-10-CM

## 2015-11-29 DIAGNOSIS — R531 Weakness: Secondary | ICD-10-CM

## 2015-11-29 MED ORDER — DOXYCYCLINE HYCLATE 100 MG PO TABS: 100.0000 mg | ORAL_TABLET | Freq: Two times a day (BID) | ORAL | Status: DC

## 2015-11-29 MED ORDER — OXYCODONE HCL 10 MG PO TABS: 10.0000 mg | ORAL_TABLET | Freq: Four times a day (QID) | ORAL | Status: DC | PRN

## 2015-11-29 MED ORDER — MORPHINE SULFATE ER 30 MG PO TBCR: 30.0000 mg | EXTENDED_RELEASE_TABLET | Freq: Three times a day (TID) | ORAL | Status: DC

## 2015-11-29 NOTE — Progress Notes (Signed)
Patient reports that she is losing strength in her right arm and leg.  The Fentanyl patch did not help her pain and made her vomit.  She has been taking the Oxycodone q5h but her pain is not controlled and she was taking it q3h with better relief.  The night after her head CT she started having drainage from her piercing in her right tear and is now black.  Called earlier this week for Hospice but was wanting their services for transportation.

## 2015-11-30 NOTE — Patient Instructions (Addendum)
Paclitaxel injection What is this medicine? PACLITAXEL (PAK li TAX el) is a chemotherapy drug. It targets fast dividing cells, like cancer cells, and causes these cells to die. This medicine is used to treat ovarian cancer, breast cancer, and other cancers. This medicine may be used for other purposes; ask your health care provider or pharmacist if you have questions. What should I tell my health care provider before I take this medicine? They need to know if you have any of these conditions: -blood disorders -irregular heartbeat -infection (especially a virus infection such as chickenpox, cold sores, or herpes) -liver disease -previous or ongoing radiation therapy -an unusual or allergic reaction to paclitaxel, alcohol, polyoxyethylated castor oil, other chemotherapy agents, other medicines, foods, dyes, or preservatives -pregnant or trying to get pregnant -breast-feeding How should I use this medicine? This drug is given as an infusion into a vein. It is administered in a hospital or clinic by a specially trained health care professional. Talk to your pediatrician regarding the use of this medicine in children. Special care may be needed. Overdosage: If you think you have taken too much of this medicine contact a poison control center or emergency room at once. NOTE: This medicine is only for you. Do not share this medicine with others. What if I miss a dose? It is important not to miss your dose. Call your doctor or health care professional if you are unable to keep an appointment. What may interact with this medicine? Do not take this medicine with any of the following medications: -disulfiram -metronidazole This medicine may also interact with the following medications: -cyclosporine -diazepam -ketoconazole -medicines to increase blood counts like filgrastim, pegfilgrastim, sargramostim -other chemotherapy drugs like cisplatin, doxorubicin, epirubicin, etoposide, teniposide,  vincristine -quinidine -testosterone -vaccines -verapamil Talk to your doctor or health care professional before taking any of these medicines: -acetaminophen -aspirin -ibuprofen -ketoprofen -naproxen This list may not describe all possible interactions. Give your health care provider a list of all the medicines, herbs, non-prescription drugs, or dietary supplements you use. Also tell them if you smoke, drink alcohol, or use illegal drugs. Some items may interact with your medicine. What should I watch for while using this medicine? Your condition will be monitored carefully while you are receiving this medicine. You will need important blood work done while you are taking this medicine. This drug may make you feel generally unwell. This is not uncommon, as chemotherapy can affect healthy cells as well as cancer cells. Report any side effects. Continue your course of treatment even though you feel ill unless your doctor tells you to stop. This medicine can cause serious allergic reactions. To reduce your risk you will need to take other medicine(s) before treatment with this medicine. In some cases, you may be given additional medicines to help with side effects. Follow all directions for their use. Call your doctor or health care professional for advice if you get a fever, chills or sore throat, or other symptoms of a cold or flu. Do not treat yourself. This drug decreases your body's ability to fight infections. Try to avoid being around people who are sick. This medicine may increase your risk to bruise or bleed. Call your doctor or health care professional if you notice any unusual bleeding. Be careful brushing and flossing your teeth or using a toothpick because you may get an infection or bleed more easily. If you have any dental work done, tell your dentist you are receiving this medicine. Avoid taking products that   contain aspirin, acetaminophen, ibuprofen, naproxen, or ketoprofen unless  instructed by your doctor. These medicines may hide a fever. Do not become pregnant while taking this medicine. Women should inform their doctor if they wish to become pregnant or think they might be pregnant. There is a potential for serious side effects to an unborn child. Talk to your health care professional or pharmacist for more information. Do not breast-feed an infant while taking this medicine. Men are advised not to father a child while receiving this medicine. This product may contain alcohol. Ask your pharmacist or healthcare provider if this medicine contains alcohol. Be sure to tell all healthcare providers you are taking this medicine. Certain medicines, like metronidazole and disulfiram, can cause an unpleasant reaction when taken with alcohol. The reaction includes flushing, headache, nausea, vomiting, sweating, and increased thirst. The reaction can last from 30 minutes to several hours. What side effects may I notice from receiving this medicine? Side effects that you should report to your doctor or health care professional as soon as possible: -allergic reactions like skin rash, itching or hives, swelling of the face, lips, or tongue -low blood counts - This drug may decrease the number of white blood cells, red blood cells and platelets. You may be at increased risk for infections and bleeding. -signs of infection - fever or chills, cough, sore throat, pain or difficulty passing urine -signs of decreased platelets or bleeding - bruising, pinpoint red spots on the skin, black, tarry stools, nosebleeds -signs of decreased red blood cells - unusually weak or tired, fainting spells, lightheadedness -breathing problems -chest pain -high or low blood pressure -mouth sores -nausea and vomiting -pain, swelling, redness or irritation at the injection site -pain, tingling, numbness in the hands or feet -slow or irregular heartbeat -swelling of the ankle, feet, hands Side effects that  usually do not require medical attention (report to your doctor or health care professional if they continue or are bothersome): -bone pain -complete hair loss including hair on your head, underarms, pubic hair, eyebrows, and eyelashes -changes in the color of fingernails -diarrhea -loosening of the fingernails -loss of appetite -muscle or joint pain -red flush to skin -sweating This list may not describe all possible side effects. Call your doctor for medical advice about side effects. You may report side effects to FDA at 1-800-FDA-1088. Where should I keep my medicine? This drug is given in a hospital or clinic and will not be stored at home. NOTE: This sheet is a summary. It may not cover all possible information. If you have questions about this medicine, talk to your doctor, pharmacist, or health care provider.    2016, [employer]. (2015-07-05 13:02:56) Carboplatin injection What is this medicine? CARBOPLATIN (KAR boe pla tin) is a chemotherapy drug. It targets fast dividing cells, like cancer cells, and causes these cells to die. This medicine is used to treat ovarian cancer and many other cancers. This medicine may be used for other purposes; ask your health care provider or pharmacist if you have questions. What should I tell my health care provider before I take this medicine? They need to know if you have any of these conditions: -blood disorders -hearing problems -kidney disease -recent or ongoing radiation therapy -an unusual or allergic reaction to carboplatin, cisplatin, other chemotherapy, other medicines, foods, dyes, or preservatives -pregnant or trying to get pregnant -breast-feeding How should I use this medicine? This drug is usually given as an infusion into a vein. It is administered in a  hospital or clinic by a specially trained health care professional. Talk to your pediatrician regarding the use of this medicine in children. Special care may be  needed. Overdosage: If you think you have taken too much of this medicine contact a poison control center or emergency room at once. NOTE: This medicine is only for you. Do not share this medicine with others. What if I miss a dose? It is important not to miss a dose. Call your doctor or health care professional if you are unable to keep an appointment. What may interact with this medicine? -medicines for seizures -medicines to increase blood counts like filgrastim, pegfilgrastim, sargramostim -some antibiotics like amikacin, gentamicin, neomycin, streptomycin, tobramycin -vaccines Talk to your doctor or health care professional before taking any of these medicines: -acetaminophen -aspirin -ibuprofen -ketoprofen -naproxen This list may not describe all possible interactions. Give your health care provider a list of all the medicines, herbs, non-prescription drugs, or dietary supplements you use. Also tell them if you smoke, drink alcohol, or use illegal drugs. Some items may interact with your medicine. What should I watch for while using this medicine? Your condition will be monitored carefully while you are receiving this medicine. You will need important blood work done while you are taking this medicine. This drug may make you feel generally unwell. This is not uncommon, as chemotherapy can affect healthy cells as well as cancer cells. Report any side effects. Continue your course of treatment even though you feel ill unless your doctor tells you to stop. In some cases, you may be given additional medicines to help with side effects. Follow all directions for their use. Call your doctor or health care professional for advice if you get a fever, chills or sore throat, or other symptoms of a cold or flu. Do not treat yourself. This drug decreases your body's ability to fight infections. Try to avoid being around people who are sick. This medicine may increase your risk to bruise or bleed.  Call your doctor or health care professional if you notice any unusual bleeding. Be careful brushing and flossing your teeth or using a toothpick because you may get an infection or bleed more easily. If you have any dental work done, tell your dentist you are receiving this medicine. Avoid taking products that contain aspirin, acetaminophen, ibuprofen, naproxen, or ketoprofen unless instructed by your doctor. These medicines may hide a fever. Do not become pregnant while taking this medicine. Women should inform their doctor if they wish to become pregnant or think they might be pregnant. There is a potential for serious side effects to an unborn child. Talk to your health care professional or pharmacist for more information. Do not breast-feed an infant while taking this medicine. What side effects may I notice from receiving this medicine? Side effects that you should report to your doctor or health care professional as soon as possible: -allergic reactions like skin rash, itching or hives, swelling of the face, lips, or tongue -signs of infection - fever or chills, cough, sore throat, pain or difficulty passing urine -signs of decreased platelets or bleeding - bruising, pinpoint red spots on the skin, black, tarry stools, nosebleeds -signs of decreased red blood cells - unusually weak or tired, fainting spells, lightheadedness -breathing problems -changes in hearing -changes in vision -chest pain -high blood pressure -low blood counts - This drug may decrease the number of white blood cells, red blood cells and platelets. You may be at increased risk for infections and  bleeding. -nausea and vomiting -pain, swelling, redness or irritation at the injection site -pain, tingling, numbness in the hands or feet -problems with balance, talking, walking -trouble passing urine or change in the amount of urine Side effects that usually do not require medical attention (report to your doctor or health  care professional if they continue or are bothersome): -hair loss -loss of appetite -metallic taste in the mouth or changes in taste This list may not describe all possible side effects. Call your doctor for medical advice about side effects. You may report side effects to FDA at 1-800-FDA-1088. Where should I keep my medicine? This drug is given in a hospital or clinic and will not be stored at home. NOTE: This sheet is a summary. It may not cover all possible information. If you have questions about this medicine, talk to your doctor, pharmacist, or health care provider.    2016, [employer]. (2008-02-22 14:38:05) Zoledronic Acid injection (Hypercalcemia, Oncology) What is this medicine? ZOLEDRONIC ACID (ZOE le dron ik AS id) lowers the amount of calcium loss from bone. It is used to treat too much calcium in your blood from cancer. It is also used to prevent complications of cancer that has spread to the bone. This medicine may be used for other purposes; ask your health care provider or pharmacist if you have questions. What should I tell my health care provider before I take this medicine? They need to know if you have any of these conditions: -aspirin-sensitive asthma -cancer, especially if you are receiving medicines used to treat cancer -dental disease or wear dentures -infection -kidney disease -receiving corticosteroids like dexamethasone or prednisone -an unusual or allergic reaction to zoledronic acid, other medicines, foods, dyes, or preservatives -pregnant or trying to get pregnant -breast-feeding How should I use this medicine? This medicine is for infusion into a vein. It is given by a health care professional in a hospital or clinic setting. Talk to your pediatrician regarding the use of this medicine in children. Special care may be needed. Overdosage: If you think you have taken too much of this medicine contact a poison control center or emergency room at  once. NOTE: This medicine is only for you. Do not share this medicine with others. What if I miss a dose? It is important not to miss your dose. Call your doctor or health care professional if you are unable to keep an appointment. What may interact with this medicine? -certain antibiotics given by injection -NSAIDs, medicines for pain and inflammation, like ibuprofen or naproxen -some diuretics like bumetanide, furosemide -teriparatide -thalidomide This list may not describe all possible interactions. Give your health care provider a list of all the medicines, herbs, non-prescription drugs, or dietary supplements you use. Also tell them if you smoke, drink alcohol, or use illegal drugs. Some items may interact with your medicine. What should I watch for while using this medicine? Visit your doctor or health care professional for regular checkups. It may be some time before you see the benefit from this medicine. Do not stop taking your medicine unless your doctor tells you to. Your doctor may order blood tests or other tests to see how you are doing. Women should inform their doctor if they wish to become pregnant or think they might be pregnant. There is a potential for serious side effects to an unborn child. Talk to your health care professional or pharmacist for more information. You should make sure that you get enough calcium and vitamin D  while you are taking this medicine. Discuss the foods you eat and the vitamins you take with your health care professional. Some people who take this medicine have severe bone, joint, and/or muscle pain. This medicine may also increase your risk for jaw problems or a broken thigh bone. Tell your doctor right away if you have severe pain in your jaw, bones, joints, or muscles. Tell your doctor if you have any pain that does not go away or that gets worse. Tell your dentist and dental surgeon that you are taking this medicine. You should not have major dental  surgery while on this medicine. See your dentist to have a dental exam and fix any dental problems before starting this medicine. Take good care of your teeth while on this medicine. Make sure you see your dentist for regular follow-up appointments. What side effects may I notice from receiving this medicine? Side effects that you should report to your doctor or health care professional as soon as possible: -allergic reactions like skin rash, itching or hives, swelling of the face, lips, or tongue -anxiety, confusion, or depression -breathing problems -changes in vision -eye pain -feeling faint or lightheaded, falls -jaw pain, especially after dental work -mouth sores -muscle cramps, stiffness, or weakness -redness, blistering, peeling or loosening of the skin, including inside the mouth -trouble passing urine or change in the amount of urine Side effects that usually do not require medical attention (report to your doctor or health care professional if they continue or are bothersome): -bone, joint, or muscle pain -constipation -diarrhea -fever -hair loss -irritation at site where injected -loss of appetite -nausea, vomiting -stomach upset -trouble sleeping -trouble swallowing -weak or tired This list may not describe all possible side effects. Call your doctor for medical advice about side effects. You may report side effects to FDA at 1-800-FDA-1088. Where should I keep my medicine? This drug is given in a hospital or clinic and will not be stored at home. NOTE: This sheet is a summary. It may not cover all possible information. If you have questions about this medicine, talk to your doctor, pharmacist, or health care provider.    2016, [employer]. (2014-04-15 14:19:39)

## 2015-12-03 DIAGNOSIS — C3490 Malignant neoplasm of unspecified part of unspecified bronchus or lung: Secondary | ICD-10-CM | POA: Insufficient documentation

## 2015-12-03 MED ORDER — PROCHLORPERAZINE MALEATE 10 MG PO TABS: 10.0000 mg | ORAL_TABLET | Freq: Four times a day (QID) | ORAL | Status: DC | PRN

## 2015-12-03 MED ORDER — ONDANSETRON HCL 8 MG PO TABS: 8.0000 mg | ORAL_TABLET | Freq: Two times a day (BID) | ORAL | Status: DC

## 2015-12-03 MED ORDER — LIDOCAINE-PRILOCAINE 2.5-2.5 % EX CREA: TOPICAL_CREAM | CUTANEOUS | Status: DC

## 2015-12-03 NOTE — Progress Notes (Signed)
[place]  [phone] [phone]  ID: Mayo Ao OB: 27-Aug-1961  MR#: [id number]  HYQ#:[id number]  Patient Care Team: No Pcp Per Patient as PCP - General (General Practice)  CHIEF COMPLAINT:  Chief Complaint  Patient presents with  . Lung Cancer    discuss test results    INTERVAL HISTORY: Patient returns to clinic today for further evaluation and treatment planning. She initially requested hospice care, but was misinformed that she could actually not undergo treatment while in hospice and this order has been rescinded. She continues to have significant pain and states the fentanyl patch made her sick. Patient also has blood and pus from her right earlobe. She also has increased shortness of breath, cough, and wheezing. She denies hemoptysis. She has a fair appetite, but denies weight loss. She has no neurologic complaints. She denies any fevers. She has no nausea, vomiting, constipation, or diarrhea. She has no urinary complaints. Patient otherwise feels well and offers no further specific complaints.  REVIEW OF SYSTEMS:   Review of Systems  Constitutional: Positive for malaise/fatigue. Negative for fever and weight loss.  Respiratory: Positive for cough, shortness of breath and wheezing. Negative for hemoptysis.   Cardiovascular: Positive for chest pain. Negative for leg swelling.  Gastrointestinal: Negative.   Musculoskeletal: Negative.   Neurological: Positive for weakness.    As per HPI. Otherwise, a complete review of systems is negatve.  PAST MEDICAL HISTORY: Past Medical History  Diagnosis Date  . Hypertension   . GERD (gastroesophageal reflux disease)   . Cancer ([facility])   . COPD (chronic obstructive pulmonary disease) ([place])   . Stroke ([place])   . Asthma   . Pneumonia     PAST SURGICAL HISTORY: Past Surgical History  Procedure Laterality Date  . Cholecystectomy    . Abdominal hysterectomy    . Tonsillectomy       FAMILY HISTORY Family History  Problem Relation Age of Onset  . Kidney cancer Mother   . Liver cancer Brother   . Lung cancer Paternal Uncle        ADVANCED DIRECTIVES:    HEALTH MAINTENANCE: Social History  Substance Use Topics  . Smoking status: Current Every Day Smoker -- 1.00 packs/day for 28 years    Types: Cigarettes  . Smokeless tobacco: Never Used  . Alcohol Use: 3.6 oz/week    0 Standard drinks or equivalent, 6 Cans of beer per week     Colonoscopy:  PAP:  Bone density:  Lipid panel:  Allergies  Allergen Reactions  . Codeine Nausea Only    Nausea if taken on an empty stomach    Current Outpatient Prescriptions  Medication Sig Dispense Refill  . ibuprofen (ADVIL,MOTRIN) 800 MG tablet Take 1 tablet (800 mg total) by mouth 3 (three) times daily. 21 tablet 0  . omeprazole (PRILOSEC) 20 MG capsule Take 20 mg by mouth daily.    . Oxycodone HCl 10 MG TABS Take 1 tablet (10 mg total) by mouth every 6 (six) hours as needed. 60 tablet 0  . doxycycline (VIBRA-TABS) 100 MG tablet Take 1 tablet (100 mg total) by mouth 2 (two) times daily. 14 tablet 0  . morphine (MS CONTIN) 30 MG 12 hr tablet Take 1 tablet (30 mg total) by mouth every 8 (eight) hours. 90 tablet 0   No current facility-administered medications for this visit.    OBJECTIVE: Filed Vitals:   11/29/15 1355  BP: 141/79  Pulse: 108  Temp: 98.8 F (37.1 C)  Resp: 18     Body mass index is 27.55 kg/(m^2).    ECOG FS:0 - Asymptomatic  General: Well-developed, well-nourished, no acute distress. Eyes: Pink conjunctiva, anicteric sclera. HEENT: Right ear lobe without erythema, but significant eschar. Lungs: Scattered wheezing throughout.  Heart: Regular rate and rhythm. No rubs, murmurs, or gallops. Abdomen: Soft, nontender, nondistended. No organomegaly noted, normoactive bowel sounds. Musculoskeletal: No edema, cyanosis, or clubbing. Neuro: Alert, answering all questions appropriately. Cranial  nerves grossly intact. Skin: No rashes or petechiae noted. Psych: Normal affect.   LAB RESULTS:  Lab Results  Component Value Date   NA 134* 11/15/2015   K 4.0 11/15/2015   CL 98* 11/15/2015   CO2 26 11/15/2015   GLUCOSE 103* 11/15/2015   BUN 8 11/15/2015   CREATININE 0.66 11/15/2015   CALCIUM 9.5 11/15/2015   PROT 7.4 11/15/2015   ALBUMIN 3.9 11/15/2015   AST 33 11/15/2015   ALT 32 11/15/2015   ALKPHOS 107 11/15/2015   BILITOT 0.3 11/15/2015   GFRNONAA >60 11/15/2015   GFRAA >60 11/15/2015    Lab Results  Component Value Date   WBC 9.3 11/12/2015   HGB 15.6 11/12/2015   HCT 46.4 11/12/2015   MCV 100.2* 11/12/2015   PLT 155 11/12/2015     STUDIES: Dg Chest 2 View  11/12/2015  CLINICAL DATA:  55 year old female with chest pain and left breast pain for the past 3 days. Difficulty breathing. EXAM: CHEST  2 VIEW COMPARISON:  No priors. FINDINGS: Fullness in the right infrahilar region, partially obscuring the right heart border on the frontal projection and projecting over the heart on the lateral projection, concerning for right middle lobe pneumonia. Left lung is clear. No pleural effusions. No pneumothorax the No evidence of pulmonary edema. Heart size is normal. Upper mediastinal contours are otherwise within normal limits. IMPRESSION: 1. Findings likely reflect right middle lobe pneumonia. Followup PA and lateral chest X-ray is recommended in 3-4 weeks following trial of antibiotic therapy to ensure resolution and exclude underlying malignancy. Electronically Signed   By: Vinnie Langton M.D.   On: 11/12/2015 12:34   Ct Head W Wo Contrast  11/23/2015  CLINICAL DATA:  Memory changes, dizziness, and unsteady gait. Lung cancer. EXAM: CT HEAD WITHOUT AND WITH CONTRAST TECHNIQUE: Contiguous axial images were obtained from the base of the skull through the vertex without and with intravenous contrast CONTRAST:  41m OMNIPAQUE IOHEXOL 300 MG/ML  SOLN COMPARISON:  None.  FINDINGS: Mild atrophy and white matter disease is advanced for age. There is no focal enhancement to suggest metastatic disease of the brain or meninges. A prominent vein is present in the medial right frontal lobe, likely a DVA. No acute hemorrhage or mass lesion is present. There is no evidence for acute infarct. The ventricles are of normal size. No significant extra-axial fluid collection is present. The calvarium is intact. The paranasal sinuses and mastoid air cells are clear. IMPRESSION: 1. Mild atrophy and white matter disease is somewhat advanced for age. This likely reflects the sequela of chronic microvascular ischemia. 2. No enhancement to suggest metastatic disease. Electronically Signed   By: CSan MorelleM.D.   On: 11/23/2015 14:35   Ct Angio Chest Pe W/cm &/or Wo Cm  11/12/2015  CLINICAL DATA:  55year old female with history of cough for the past 3-4 weeks, worsening acutely over the past 3-4 days, now with shortness of breath and pain during respiration. Pain is worse during deep breathing and movement. EXAM: CT ANGIOGRAPHY CHEST  WITH CONTRAST TECHNIQUE: Multidetector CT imaging of the chest was performed using the standard protocol during bolus administration of intravenous contrast. Multiplanar CT image reconstructions and MIPs were obtained to evaluate the vascular anatomy. CONTRAST:  51m OMNIPAQUE IOHEXOL 350 MG/ML SOLN COMPARISON:  No priors. FINDINGS: Mediastinum/Lymph Nodes: There are no filling defects within the pulmonary arterial tree to suggest pulmonary embolism. There is extensive lymphadenopathy in the right hilar region and throughout the mediastinum bilaterally. Specific examples include a bulky conglomerate nodal mass in the right hilar region measuring approximately 3.4 x 3.1 cm (image 67 of series 4), subcarinal node measuring 2.4 cm in short axis, low right paratracheal lymph node measuring 20 mm in short axis, low left paratracheal lymph node measuring 12 mm in  short axis, prevascular lymph nodes measuring up to 17 mm in short axis, and a superior mediastinal lymph node adjacent to the proximal left common carotid artery measuring 11 mm in short axis. No left hilar adenopathy is noted at this time. Heart size is normal. Small amount of pericardial fluid and/or thickening anteriorly, unlikely to be of any hemodynamic significance at this time. No associated pericardial calcification. Atherosclerosis in the thoracic aorta and great vessels of the mediastinum. Esophagus is unremarkable in appearance. No axillary lymphadenopathy. Lungs/Pleura: In the central aspect of the right middle lobe there is a mass-like opacity measuring 2.6 x 3.5 cm (image 77 of series 6), suspicious for potential primary bronchogenic neoplasm. Distal to this there are what appear to be postobstructive changes in the right middle lobe, most evident in the lateral segment. Septal thickening and patchy nodularity is noted throughout much of the right lung, particularly in the right middle and upper lobes. The largest nodules include a 6 mm right upper lobe nodule (image 43 of series 6), and a 6 mm right lower lobe nodule (image 66 of series 6). There is a generalized pattern of centrilobular ground-glass attenuation micronodularity in both lungs which is most prominent in the upper lungs, likely to reflect a smoking related disease such as respiratory bronchiolitis, or respiratory bronchiolitis interstitial lung disease. No pleural effusions. Upper Abdomen: Diffuse low attenuation throughout the hepatic parenchyma, compatible with a background of severe hepatic steatosis. There are multifocal hypovascular hepatic lesions, highly concerning for metastatic disease, the largest of which is a 5.9 x 4.5 cm mass-like area in the central aspect of the right lobe of the liver predominantly involving segment 8 (image 96 of series 4). Multiple borderline enlarged and mildly enlarged lymph nodes in the upper  abdomen, largest which measures 1.2 cm in the gastrohepatic ligament. Musculoskeletal/Soft Tissues: Nondisplaced fracture of the left first rib. Old compression fracture of T12 with approximately 20% loss of anterior vertebral body height. There are no aggressive appearing lytic or blastic lesions noted in the visualized portions of the skeleton. Review of the MIP images confirms the above findings. IMPRESSION: 1. No evidence of pulmonary embolism. 2. Importantly, there is evidence of probable primary bronchogenic malignancy in the right middle lobe with associated lymphangitic spread of tumor in the right lung, right hilar and bilateral mediastinal lymphadenopathy, and multiple metastatic lesions throughout the liver with some upper abdominal lymphadenopathy, as above, highly concerning for stage IV lung cancer. Further evaluation with PET-CT and/or biopsy is recommended in the near future for diagnostic and staging purposes. 3. Pattern of diffuse centrilobular ground-glass attenuation micro nodularity in the lungs bilaterally, suggestive of a smoking related disease such as respiratory bronchiolitis or respiratory bronchiolitis interstitial lung disease. 4. Atherosclerosis. Electronically  Signed   By: Vinnie Langton M.D.   On: 11/12/2015 16:29   Nm Pet Image Initial (pi) Skull Base To Thigh  11/19/2015  CLINICAL DATA:  Initial treatment strategy for lung cancer. EXAM: NUCLEAR MEDICINE PET SKULL BASE TO THIGH TECHNIQUE: 12.8 mCi F-18 FDG was injected intravenously. Full-ring PET imaging was performed from the skull base to thigh after the radiotracer. CT data was obtained and used for attenuation correction and anatomic localization. FASTING BLOOD GLUCOSE:  Value: 123 mg/dl COMPARISON:  CT chest 11/12/2015. FINDINGS: NECK No hypermetabolic lymph nodes in the neck. CT images show no acute findings. CHEST Hypermetabolic bulky mediastinal adenopathy. Index mass in the lower right paratracheal station measures  approximately 2.1 cm (CT image 81) with an SUV max of 15.4. Right hilar mass is difficult to measure without IV contrast and has an SUV max of 14.7. Postobstructive pneumonitis in the right middle lobe is hypermetabolic. There are scattered pulmonary nodules in the right lung, sub cm in size, some of which hypermetabolic. Index nodule in the right lower lobe measures approximately 10 mm (CT image 115) with an SUV max of 3.3. No pericardial or pleural effusion. ABDOMEN/PELVIS Numerous hypermetabolic lesions are seen within the liver, poorly visualized on CT, possibly due to diffuse low-attenuation. Index lesion in the dome of the liver has an SUV max of 20.5. Lymph node adjacent to the distal stomach measures 1.4 cm (CT image 134) with an SUV max of 13.2. Scattered omental nodules measure up to 1.3 cm in the left abdomen (CT image 181), with an SUV max of 10.3. Left common iliac lymph node measures approximately 1.3 cm (image 196) with an SUV max of 11.7. Finally, there are scattered hypermetabolic subcutaneous nodules along the left abdominal wall and posterior to the left hip. CT images show no acute findings. SKELETON Numerous hypermetabolic lesions are seen in the left frontal bone, right humerus, ribs, spine, sternum, right acetabulum and proximal left femur. IMPRESSION: 1. Stage IV primary bronchogenic carcinoma as evidenced by hypermetabolic right hilar mass, mediastinal/abdominal/pelvic adenopathy, right lung nodules, hepatic lesions, omental nodules, soft tissue nodules and osseous lesions. 2. Hepatic steatosis. Electronically Signed   By: Lorin Picket M.D.   On: 11/19/2015 14:14   US Biopsy  11/21/2015  CLINICAL DATA:  55 year old female with a history of lung mass and multiple FDG positive lesions throughout the body. She has been referred for biopsy for tissue diagnosis. EXAM: ULTRASOUND GUIDED CORE BIOPSY OF FDG POSITIVE LESION OF THE LEFT BODY WALL MEDICATIONS: No sedation PROCEDURE: The  procedure, risks, benefits, and alternatives were explained to the patient. Questions regarding the procedure were encouraged and answered. The patient understands and consents to the procedure. Ultrasound survey was performed with images stored and sent to PACs. The left abdominal wall was prepped with chlorhexidine in a sterile fashion, and a sterile drape was applied covering the operative field. A sterile gown and sterile gloves were used for the procedure. Local anesthesia was provided with 1% Lidocaine. Ultrasound guidance was used to infiltrate the region with 1% lidocaine for local anesthesia. Once the region was generously infiltrated 1% lidocaine, a small stab incision was made with 11 blade scalpel, and an 18 gauge biopsy device was advanced under ultrasound guidance into the soft tissue lesion. Multiple core biopsy achieved. Samples were placed into formalin. Final image was stored after biopsy. Patient tolerated the procedure well and remained hemodynamically stable throughout. No complications were encountered and no significant blood loss was encounter COMPLICATIONS: None. FINDINGS:  Ultrasound survey demonstrates abnormal soft tissue of the left anterior abdominal wall, just inferior to the left breast. Images during the case demonstrate needle tip within the nodule on each needle pass. Final image demonstrates no complicating features. IMPRESSION: Status post ultrasound-guided biopsy of left-sided abdominal wall soft tissue nodule which is FDG positive on prior PET-CT. Tissue specimen sent to pathology for complete histopathologic analysis. Signed, Dulcy Fanny. Earleen Newport, DO [facility] Electronically Signed   By: Corrie Mckusick D.O.   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 Right lung mass with multiple liver lesions suspicious for metastatic lung  cancer.  PLAN:    1. Lung cancer: CT and PET scan results reviewed independently confirming stage IV squamous cell carcinoma of the lung. CT of the brain did not reveal metastatic disease. Patient initially requested hospice, but was misinformed that she would unable to receive treatment therefore this has been rescinded. Patient will require port placement prior to initiation of chemotherapy. Return to clinic in 2 weeks for initiation of cycle 1 of carboplatinum, Taxol, and Zometa. Patient will receive chemotherapy every 3 weeks and plan to reimage after 4 cycles. 2. Pain: Patient states fentanyl patch made her sick. Patient was given a prescription for MS Contin today as well as short acting oxycodone. 3. Right ear drainage: Patient was given a prescription for doxycycline today.   Proximal 30 minutes was spent in discussion of which greater than 50% was consultation.  Patient expressed understanding and was in agreement with this plan. She also understands that She can call clinic at any time with any questions, concerns, or complaints.    Lloyd Huger, MD   12/03/2015 12:00 PM

## 2015-12-06 ENCOUNTER — Inpatient Hospital Stay: Payer: Self-pay

## 2015-12-07 ENCOUNTER — Ambulatory Visit: Payer: Self-pay

## 2015-12-10 ENCOUNTER — Ambulatory Visit: Payer: Self-pay | Admitting: Oncology

## 2015-12-11 ENCOUNTER — Inpatient Hospital Stay: Payer: Self-pay | Attending: Oncology

## 2015-12-11 ENCOUNTER — Inpatient Hospital Stay: Payer: Self-pay

## 2015-12-11 DIAGNOSIS — R5383 Other fatigue: Secondary | ICD-10-CM | POA: Insufficient documentation

## 2015-12-11 DIAGNOSIS — F17210 Nicotine dependence, cigarettes, uncomplicated: Secondary | ICD-10-CM | POA: Insufficient documentation

## 2015-12-11 DIAGNOSIS — Z79899 Other long term (current) drug therapy: Secondary | ICD-10-CM | POA: Insufficient documentation

## 2015-12-11 DIAGNOSIS — K219 Gastro-esophageal reflux disease without esophagitis: Secondary | ICD-10-CM | POA: Insufficient documentation

## 2015-12-11 DIAGNOSIS — R5381 Other malaise: Secondary | ICD-10-CM | POA: Insufficient documentation

## 2015-12-11 DIAGNOSIS — C3491 Malignant neoplasm of unspecified part of right bronchus or lung: Secondary | ICD-10-CM | POA: Insufficient documentation

## 2015-12-11 DIAGNOSIS — I10 Essential (primary) hypertension: Secondary | ICD-10-CM | POA: Insufficient documentation

## 2015-12-11 DIAGNOSIS — G893 Neoplasm related pain (acute) (chronic): Secondary | ICD-10-CM | POA: Insufficient documentation

## 2015-12-11 DIAGNOSIS — Z8673 Personal history of transient ischemic attack (TIA), and cerebral infarction without residual deficits: Secondary | ICD-10-CM | POA: Insufficient documentation

## 2015-12-11 DIAGNOSIS — J449 Chronic obstructive pulmonary disease, unspecified: Secondary | ICD-10-CM | POA: Insufficient documentation

## 2015-12-11 DIAGNOSIS — K769 Liver disease, unspecified: Secondary | ICD-10-CM | POA: Insufficient documentation

## 2015-12-11 DIAGNOSIS — R0789 Other chest pain: Secondary | ICD-10-CM | POA: Insufficient documentation

## 2015-12-12 ENCOUNTER — Telehealth: Payer: Self-pay

## 2015-12-12 NOTE — Telephone Encounter (Signed)
-----   Message from Cephus Richer sent at 12/11/2015 10:58 AM EST ----- Per pt isn't going to take chemo. She is coming to see MD on 12-13-15. PT didn't come to chemo class. I will take her off 12-13-15 for chemo.

## 2015-12-13 ENCOUNTER — Other Ambulatory Visit: Payer: Self-pay

## 2015-12-13 ENCOUNTER — Inpatient Hospital Stay (HOSPITAL_BASED_OUTPATIENT_CLINIC_OR_DEPARTMENT_OTHER): Payer: Self-pay | Admitting: Oncology

## 2015-12-13 ENCOUNTER — Inpatient Hospital Stay: Payer: Self-pay

## 2015-12-13 ENCOUNTER — Ambulatory Visit: Payer: Self-pay | Admitting: Oncology

## 2015-12-13 ENCOUNTER — Telehealth: Payer: Self-pay | Admitting: *Deleted

## 2015-12-13 ENCOUNTER — Inpatient Hospital Stay: Payer: Self-pay | Admitting: Oncology

## 2015-12-13 VITALS — BP 136/85 | HR 109 | Temp 97.8°F | Wt 147.5 lb

## 2015-12-13 DIAGNOSIS — Z79899 Other long term (current) drug therapy: Secondary | ICD-10-CM

## 2015-12-13 DIAGNOSIS — R5383 Other fatigue: Secondary | ICD-10-CM

## 2015-12-13 DIAGNOSIS — Z8673 Personal history of transient ischemic attack (TIA), and cerebral infarction without residual deficits: Secondary | ICD-10-CM

## 2015-12-13 DIAGNOSIS — R0789 Other chest pain: Secondary | ICD-10-CM

## 2015-12-13 DIAGNOSIS — J449 Chronic obstructive pulmonary disease, unspecified: Secondary | ICD-10-CM

## 2015-12-13 DIAGNOSIS — I10 Essential (primary) hypertension: Secondary | ICD-10-CM

## 2015-12-13 DIAGNOSIS — C3491 Malignant neoplasm of unspecified part of right bronchus or lung: Secondary | ICD-10-CM

## 2015-12-13 DIAGNOSIS — K769 Liver disease, unspecified: Secondary | ICD-10-CM

## 2015-12-13 DIAGNOSIS — C3401 Malignant neoplasm of right main bronchus: Secondary | ICD-10-CM

## 2015-12-13 DIAGNOSIS — G893 Neoplasm related pain (acute) (chronic): Secondary | ICD-10-CM

## 2015-12-13 DIAGNOSIS — F17210 Nicotine dependence, cigarettes, uncomplicated: Secondary | ICD-10-CM

## 2015-12-13 DIAGNOSIS — R5381 Other malaise: Secondary | ICD-10-CM

## 2015-12-13 DIAGNOSIS — K219 Gastro-esophageal reflux disease without esophagitis: Secondary | ICD-10-CM

## 2015-12-13 LAB — COMPREHENSIVE METABOLIC PANEL
ALBUMIN: 3.5 g/dL (ref 3.5–5.0)
ALT: 21 U/L (ref 14–54)
ANION GAP: 11 (ref 5–15)
AST: 29 U/L (ref 15–41)
Alkaline Phosphatase: 162 U/L — ABNORMAL HIGH (ref 38–126)
BILIRUBIN TOTAL: 0.7 mg/dL (ref 0.3–1.2)
BUN: 6 mg/dL (ref 6–20)
CHLORIDE: 95 mmol/L — AB (ref 101–111)
CO2: 24 mmol/L (ref 22–32)
Calcium: 9.7 mg/dL (ref 8.9–10.3)
Creatinine, Ser: 0.62 mg/dL (ref 0.44–1.00)
GFR calc Af Amer: >60 mL/min (ref >60)
Glucose, Bld: 116 mg/dL — ABNORMAL HIGH (ref 65–99)
POTASSIUM: 3.9 mmol/L (ref 3.5–5.1)
Sodium: 130 mmol/L — ABNORMAL LOW (ref 135–145)
TOTAL PROTEIN: 7.7 g/dL (ref 6.5–8.1)

## 2015-12-13 LAB — CBC WITH DIFFERENTIAL/PLATELET
BASOS ABS: 0 10*3/uL (ref 0–0.1)
Basophils Relative: 0 %
EOS PCT: 1 %
Eosinophils Absolute: 0 10*3/uL (ref 0–0.7)
HEMATOCRIT: 43.4 % (ref 35.0–47.0)
Hemoglobin: 14.9 g/dL (ref 12.0–16.0)
LYMPHS ABS: 1 10*3/uL (ref 1.0–3.6)
LYMPHS PCT: 10 %
MCH: 32.7 pg (ref 26.0–34.0)
MCHC: 34.3 g/dL (ref 32.0–36.0)
MCV: 95.3 fL (ref 80.0–100.0)
Monocytes Absolute: 0.7 10*3/uL (ref 0.2–0.9)
Monocytes Relative: 7 %
NEUTROS PCT: 82 %
Neutro Abs: 8.4 10*3/uL — ABNORMAL HIGH (ref 1.4–6.5)
PLATELETS: 203 10*3/uL (ref 150–440)
RBC: 4.55 MIL/uL (ref 3.80–5.20)
RDW: 12.9 % (ref 11.5–14.5)
WBC: 10.1 10*3/uL (ref 3.6–11.0)

## 2015-12-13 MED ORDER — ALBUTEROL SULFATE (2.5 MG/3ML) 0.083% IN NEBU: 2.5000 mg | INHALATION_SOLUTION | Freq: Four times a day (QID) | RESPIRATORY_TRACT | Status: DC | PRN

## 2015-12-13 MED ORDER — FLUTICASONE-SALMETEROL 100-50 MCG/DOSE IN AEPB: 1.0000 | INHALATION_SPRAY | Freq: Two times a day (BID) | RESPIRATORY_TRACT | Status: DC

## 2015-12-13 MED ORDER — OXYCODONE HCL 10 MG PO TABS: 20.0000 mg | ORAL_TABLET | ORAL | Status: DC | PRN

## 2015-12-13 NOTE — Telephone Encounter (Signed)
Asking you evaluate her lungs and order a nebulizer for her or an inhaler. Shew is wheezing and congested

## 2015-12-13 NOTE — Telephone Encounter (Signed)
Albuterol was e-scribed to pharmacy.

## 2015-12-13 NOTE — Progress Notes (Signed)
Patient says she has decided not to receive chemotherapy and stay with Hospice care.  She is having wheezing that seems to worsen with activity.  Would like to discuss pain medications because the regimen she is on now does not help with her right arm pain that radiates up to the shoulder.  She is taking the Morphine '30mg'$  q8h but reports that this just makes her sleepy and still taking the Oxycodone about ever 4-5h.

## 2015-12-14 NOTE — Progress Notes (Signed)
[place]  [phone] [phone]) [phone]  ID: Ronnika Collett OB: Feb 21, 1961  MR#: [id number]  KKX#:[id number]  Patient Care Team: No Pcp Per Patient as PCP - General (General Practice)  CHIEF COMPLAINT:  Chief Complaint  Patient presents with  . Lung Cancer    INTERVAL HISTORY: Patient returns to clinic today for further evaluation. She has now changed her mind again and wishes no further treatments and to enroll in hospice care. She continues to have significant pain and worsening shortness of breath. She denies hemoptysis. She has a fair appetite, but denies weight loss. She has no neurologic complaints. She denies any fevers. She has no nausea, vomiting, constipation, or diarrhea. She has no urinary complaints. Patient otherwise feels well and offers no further specific complaints.  REVIEW OF SYSTEMS:   Review of Systems  Constitutional: Positive for malaise/fatigue. Negative for fever and weight loss.  Respiratory: Positive for cough, shortness of breath and wheezing. Negative for hemoptysis.   Cardiovascular: Positive for chest pain. Negative for leg swelling.  Gastrointestinal: Negative.   Musculoskeletal: Negative.   Neurological: Negative.   Psychiatric/Behavioral: Negative.     As per HPI. Otherwise, a complete review of systems is negatve.  PAST MEDICAL HISTORY: Past Medical History  Diagnosis Date  . Hypertension   . GERD (gastroesophageal reflux disease)   . Cancer ([place])   . COPD (chronic obstructive pulmonary disease) ([place])   . Stroke ([place])   . Asthma   . Pneumonia     PAST SURGICAL HISTORY: Past Surgical History  Procedure Laterality Date  . Cholecystectomy    . Abdominal hysterectomy    . Tonsillectomy      FAMILY HISTORY Family History  Problem Relation Age of Onset  . Kidney cancer Mother   . Liver cancer Brother   . Lung cancer Paternal Uncle        ADVANCED DIRECTIVES:    HEALTH  MAINTENANCE: Social History  Substance Use Topics  . Smoking status: Current Every Day Smoker -- 1.00 packs/day for 28 years    Types: Cigarettes  . Smokeless tobacco: Never Used  . Alcohol Use: 3.6 oz/week    0 Standard drinks or equivalent, 6 Cans of beer per week     Colonoscopy:  PAP:  Bone density:  Lipid panel:  Allergies  Allergen Reactions  . Codeine Nausea Only    Nausea if taken on an empty stomach    Current Outpatient Prescriptions  Medication Sig Dispense Refill  . ibuprofen (ADVIL,MOTRIN) 800 MG tablet Take 1 tablet (800 mg total) by mouth 3 (three) times daily. 21 tablet 0  . lidocaine-prilocaine (EMLA) cream Apply to affected area once 30 g 3  . morphine (MS CONTIN) 30 MG 12 hr tablet Take 1 tablet (30 mg total) by mouth every 8 (eight) hours. 90 tablet 0  . omeprazole (PRILOSEC) 20 MG capsule Take 20 mg by mouth daily.    . ondansetron (ZOFRAN) 8 MG tablet Take 1 tablet (8 mg total) by mouth 2 (two) times daily. Start the day after chemo for 3 days. Then take as needed for nausea or vomiting. 30 tablet 1  . Oxycodone HCl 10 MG TABS Take 2 tablets (20 mg total) by mouth every 4 (four) hours as needed. 120 tablet 0  . prochlorperazine (COMPAZINE) 10 MG tablet Take 1 tablet (10 mg total) by mouth every 6 (six) hours as needed (Nausea or vomiting). 30 tablet 1  . albuterol (PROVENTIL) (2.5 MG/3ML) 0.083% nebulizer solution Take  3 mLs (2.5 mg total) by nebulization every 6 (six) hours as needed for wheezing or shortness of breath. 75 mL 12  . doxycycline (VIBRA-TABS) 100 MG tablet Take 1 tablet (100 mg total) by mouth 2 (two) times daily. (Patient not taking: Reported on 12/13/2015) 14 tablet 0  . Fluticasone-Salmeterol (ADVAIR DISKUS) 100-50 MCG/DOSE AEPB Inhale 1 puff into the lungs 2 (two) times daily. 60 each 2   No current facility-administered medications for this visit.    OBJECTIVE: Filed Vitals:   12/13/15 1014  BP: 136/85  Pulse: 109  Temp: 97.8 F (36.6  C)     Body mass index is 26.3 kg/(m^2).    ECOG FS:0 - Asymptomatic  General: Well-developed, well-nourished, no acute distress. Eyes: Pink conjunctiva, anicteric sclera. HEENT: Right ear lobe improving. Lungs: Scattered wheezing throughout.  Heart: Regular rate and rhythm. No rubs, murmurs, or gallops. Abdomen: Soft, nontender, nondistended. No organomegaly noted, normoactive bowel sounds. Musculoskeletal: No edema, cyanosis, or clubbing. Neuro: Alert, answering all questions appropriately. Cranial nerves grossly intact. Skin: No rashes or petechiae noted. Psych: Normal affect.   LAB RESULTS:  Lab Results  Component Value Date   NA 130* 12/13/2015   K 3.9 12/13/2015   CL 95* 12/13/2015   CO2 24 12/13/2015   GLUCOSE 116* 12/13/2015   BUN 6 12/13/2015   CREATININE 0.62 12/13/2015   CALCIUM 9.7 12/13/2015   PROT 7.7 12/13/2015   ALBUMIN 3.5 12/13/2015   AST 29 12/13/2015   ALT 21 12/13/2015   ALKPHOS 162* 12/13/2015   BILITOT 0.7 12/13/2015   GFRNONAA >60 12/13/2015   GFRAA >60 12/13/2015    Lab Results  Component Value Date   WBC 10.1 12/13/2015   NEUTROABS 8.4* 12/13/2015   HGB 14.9 12/13/2015   HCT 43.4 12/13/2015   MCV 95.3 12/13/2015   PLT 203 12/13/2015     STUDIES: Ct Head W Wo Contrast  11/23/2015  CLINICAL DATA:  Memory changes, dizziness, and unsteady gait. Lung cancer. EXAM: CT HEAD WITHOUT AND WITH CONTRAST TECHNIQUE: Contiguous axial images were obtained from the base of the skull through the vertex without and with intravenous contrast CONTRAST:  51m OMNIPAQUE IOHEXOL 300 MG/ML  SOLN COMPARISON:  None. FINDINGS: Mild atrophy and white matter disease is advanced for age. There is no focal enhancement to suggest metastatic disease of the brain or meninges. A prominent vein is present in the medial right frontal lobe, likely a DVA. No acute hemorrhage or mass lesion is present. There is no evidence for acute infarct. The ventricles are of normal size. No  significant extra-axial fluid collection is present. The calvarium is intact. The paranasal sinuses and mastoid air cells are clear. IMPRESSION: 1. Mild atrophy and white matter disease is somewhat advanced for age. This likely reflects the sequela of chronic microvascular ischemia. 2. No enhancement to suggest metastatic disease. Electronically Signed   By: CSan MorelleM.D.   On: 11/23/2015 14:35   Nm Pet Image Initial (pi) Skull Base To Thigh  11/19/2015  CLINICAL DATA:  Initial treatment strategy for lung cancer. EXAM: NUCLEAR MEDICINE PET SKULL BASE TO THIGH TECHNIQUE: 12.8 mCi F-18 FDG was injected intravenously. Full-ring PET imaging was performed from the skull base to thigh after the radiotracer. CT data was obtained and used for attenuation correction and anatomic localization. FASTING BLOOD GLUCOSE:  Value: 123 mg/dl COMPARISON:  CT chest 11/12/2015. FINDINGS: NECK No hypermetabolic lymph nodes in the neck. CT images show no acute findings. CHEST Hypermetabolic bulky mediastinal  adenopathy. Index mass in the lower right paratracheal station measures approximately 2.1 cm (CT image 81) with an SUV max of 15.4. Right hilar mass is difficult to measure without IV contrast and has an SUV max of 14.7. Postobstructive pneumonitis in the right middle lobe is hypermetabolic. There are scattered pulmonary nodules in the right lung, sub cm in size, some of which hypermetabolic. Index nodule in the right lower lobe measures approximately 10 mm (CT image 115) with an SUV max of 3.3. No pericardial or pleural effusion. ABDOMEN/PELVIS Numerous hypermetabolic lesions are seen within the liver, poorly visualized on CT, possibly due to diffuse low-attenuation. Index lesion in the dome of the liver has an SUV max of 20.5. Lymph node adjacent to the distal stomach measures 1.4 cm (CT image 134) with an SUV max of 13.2. Scattered omental nodules measure up to 1.3 cm in the left abdomen (CT image 181), with an SUV  max of 10.3. Left common iliac lymph node measures approximately 1.3 cm (image 196) with an SUV max of 11.7. Finally, there are scattered hypermetabolic subcutaneous nodules along the left abdominal wall and posterior to the left hip. CT images show no acute findings. SKELETON Numerous hypermetabolic lesions are seen in the left frontal bone, right humerus, ribs, spine, sternum, right acetabulum and proximal left femur. IMPRESSION: 1. Stage IV primary bronchogenic carcinoma as evidenced by hypermetabolic right hilar mass, mediastinal/abdominal/pelvic adenopathy, right lung nodules, hepatic lesions, omental nodules, soft tissue nodules and osseous lesions. 2. Hepatic steatosis. Electronically Signed   By: Lorin Picket M.D.   On: 11/19/2015 14:14   US Biopsy  11/21/2015  CLINICAL DATA:  55 year old female with a history of lung mass and multiple FDG positive lesions throughout the body. She has been referred for biopsy for tissue diagnosis. EXAM: ULTRASOUND GUIDED CORE BIOPSY OF FDG POSITIVE LESION OF THE LEFT BODY WALL MEDICATIONS: No sedation PROCEDURE: The procedure, risks, benefits, and alternatives were explained to the patient. Questions regarding the procedure were encouraged and answered. The patient understands and consents to the procedure. Ultrasound survey was performed with images stored and sent to PACs. The left abdominal wall was prepped with chlorhexidine in a sterile fashion, and a sterile drape was applied covering the operative field. A sterile gown and sterile gloves were used for the procedure. Local anesthesia was provided with 1% Lidocaine. Ultrasound guidance was used to infiltrate the region with 1% lidocaine for local anesthesia. Once the region was generously infiltrated 1% lidocaine, a small stab incision was made with 11 blade scalpel, and an 18 gauge biopsy device was advanced under ultrasound guidance into the soft tissue lesion. Multiple core biopsy achieved. Samples were placed  into formalin. Final image was stored after biopsy. Patient tolerated the procedure well and remained hemodynamically stable throughout. No complications were encountered and no significant blood loss was encounter COMPLICATIONS: None. FINDINGS: Ultrasound survey demonstrates abnormal soft tissue of the left anterior abdominal wall, just inferior to the left breast. Images during the case demonstrate needle tip within the nodule on each needle pass. Final image demonstrates no complicating features. IMPRESSION: Status post ultrasound-guided biopsy of left-sided abdominal wall soft tissue nodule which is FDG positive on prior PET-CT. Tissue specimen sent to pathology for complete histopathologic analysis. Signed, Dulcy Fanny. Earleen Newport, DO [facility] Electronically Signed   By: Corrie Mckusick D.O.   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  Right lung mass with multiple liver lesions suspicious for metastatic lung cancer.  PLAN:    1. Lung cancer: CT and PET scan results reviewed independently confirming stage IV squamous cell carcinoma of the lung. CT of the brain did not reveal metastatic disease. Patient has now clearly stated that she does not wish treatment and is enrolled in hospice. No further follow-up has been scheduled.  2. Pain: Patient states fentanyl patch made her sick. Continue MS Contin as well as short acting oxycodone. 3. Right ear drainage: improved. Patient completed her doxycycline prescription. 4. Shortness of breath: Patient was given an albuterol inhaler as well as Advair Diskus.  Approximately 30 minutes was spent in discussion of which greater than 50% was consultation.  Patient expressed understanding and was in agreement with this plan. She also understands that She can call clinic at any time with any questions, concerns, or complaints.     Lloyd Huger, MD   12/14/2015 12:49 PM

## 2015-12-20 ENCOUNTER — Telehealth: Payer: Self-pay | Admitting: *Deleted

## 2015-12-20 MED ORDER — DEXAMETHASONE 2 MG PO TABS: 2.0000 mg | ORAL_TABLET | Freq: Two times a day (BID) | ORAL | Status: DC

## 2015-12-20 MED ORDER — ALBUTEROL SULFATE HFA 108 (90 BASE) MCG/ACT IN AERS: 2.0000 | INHALATION_SPRAY | Freq: Four times a day (QID) | RESPIRATORY_TRACT | Status: DC | PRN

## 2015-12-20 NOTE — Telephone Encounter (Signed)
i thought we ordered the MDI, if not that is fine.  Story for dexamethasone.  Agree with no additional oxycodone

## 2015-12-20 NOTE — Telephone Encounter (Signed)
Reports that she is using Advair as a rescue inhaler and requests we order Albuteral MDI for her. She has cut back her MS contin from TID to bid and has increased her Oxycodone use to 14 tabs a day. Thinks it is an addiction problem, she reeducated pt regarding med use and told her we  Will not give more Oxycodone if she uses it up early. She is complaining of pain in shoulder sand other joints and has poor appetite so requesting dexamethasone rx be sent in to help with pain and appetite

## 2015-12-24 ENCOUNTER — Telehealth: Payer: Self-pay | Admitting: *Deleted

## 2015-12-24 MED ORDER — OXYCODONE HCL 10 MG PO TABS: 10.0000 mg | ORAL_TABLET | Freq: Four times a day (QID) | ORAL | Status: DC | PRN

## 2015-12-24 NOTE — Telephone Encounter (Signed)
Requesting refill on Oxycodone, has almost full bottle of MS Contin written for on 12/29

## 2015-12-24 NOTE — Telephone Encounter (Addendum)
Bethany Hood notified of dose reduction and pt also informed and was upset stating  "I do not have an appetite for Morphine like I do for the Oxycodone, so you are going to let me hurt?"

## 2015-12-24 NOTE — Telephone Encounter (Signed)
Refill Oxycodone 1 tab every 6 hours prn and it must last her 2 weeks per Dr Grayland Ormond.

## 2016-01-04 ENCOUNTER — Telehealth: Payer: Self-pay | Admitting: *Deleted

## 2016-01-04 ENCOUNTER — Telehealth: Payer: Self-pay | Admitting: Oncology

## 2016-01-04 ENCOUNTER — Encounter: Payer: Self-pay | Admitting: *Deleted

## 2016-01-04 NOTE — Progress Notes (Signed)
Patient is moving to [place] to live with her sister. She has transferred to [facility] in [place]. [name] is the contact for their office, fax number is [phone].

## 2016-01-04 NOTE — Telephone Encounter (Signed)
Spoke with Clarise Cruz who states that everything is taken care of at this time.

## 2016-01-04 NOTE — Telephone Encounter (Signed)
I spoke with [name] from [facility] in [place] to take over care for patient as of Monday 2/6. Sarah to fax hospice admission forms today. I have faxed over an order for hospital bed.

## 2016-01-04 NOTE — Telephone Encounter (Signed)
Forwarded to MD.

## 2016-01-04 NOTE — Telephone Encounter (Signed)
[name] said patient is moving with her sister to the [place] area and transferring care to them. Family wants Bethany Hood to continue to follow her. [name] wants to know if Bethany Hood is agreeable to continue following patient. Please advise. [phone]

## 2016-01-04 NOTE — Telephone Encounter (Signed)
Not a problem.

## 2016-01-04 NOTE — Telephone Encounter (Signed)
Needs a callback about pt coming into their services.

## 2016-01-08 ENCOUNTER — Telehealth: Payer: Self-pay | Admitting: *Deleted

## 2016-01-08 NOTE — Telephone Encounter (Signed)
Patient was opened to services yesterday. Call if you need her.

## 2016-01-16 ENCOUNTER — Telehealth: Payer: Self-pay | Admitting: *Deleted

## 2016-01-16 NOTE — Telephone Encounter (Signed)
Received fax for Medical Records from [facility]; forwarded to [facility] for Email and scan/SLS 02/15

## 2016-01-21 ENCOUNTER — Telehealth: Payer: Self-pay | Admitting: *Deleted

## 2016-01-21 NOTE — Telephone Encounter (Signed)
Yes, agree with care being transferred to [facility]osp[facility].

## 2016-01-21 NOTE — Telephone Encounter (Signed)
Pt and her sister are requesting the hospice MD follow her and they need a call back form [place] to know whether or not to proceed

## 2016-01-21 NOTE — Telephone Encounter (Signed)
I returned call to [facility] and left msg OK to transfer care

## 2016-01-26 IMAGING — CT CT HEAD WO/W CM
1 of 2 series · 13 of 30 positions shown, 17 images · IV contrast (omnipaque)
Comparison: None.

CLINICAL DATA: Memory changes, dizziness, and unsteady gait. Lung
cancer.

EXAM:
CT HEAD WITHOUT AND WITH CONTRAST
TECHNIQUE: Contiguous axial images were obtained from the base of the skull
through the vertex without and with intravenous contrast
CONTRAST:  75mL OMNIPAQUE IOHEXOL 300 MG/ML  SOLN

[Series 2: head wo · axial · 0.42mm/px · z∈[+567,+687]mm · 13 of 30 slices shown, 17 images]
[im 3/30  brain]
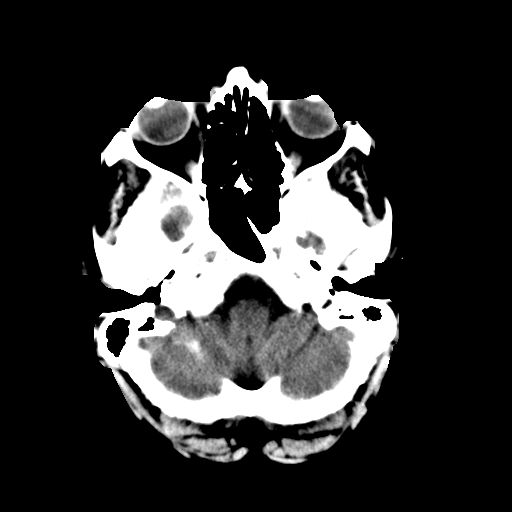
[im 3/30  bone]
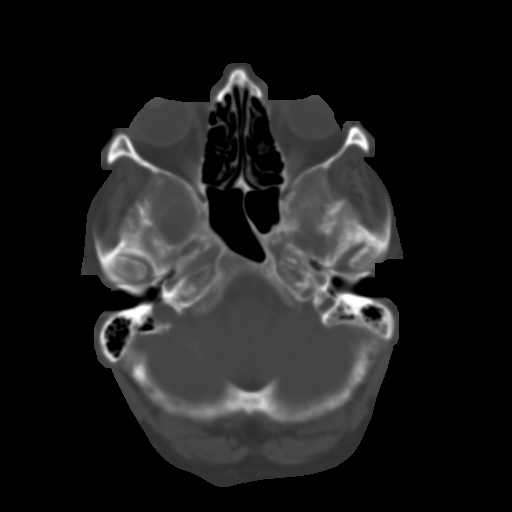
[im 5/30  brain]
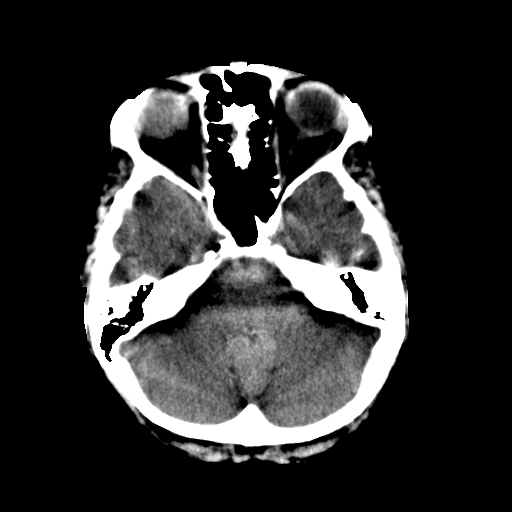
[im 7/30  brain]
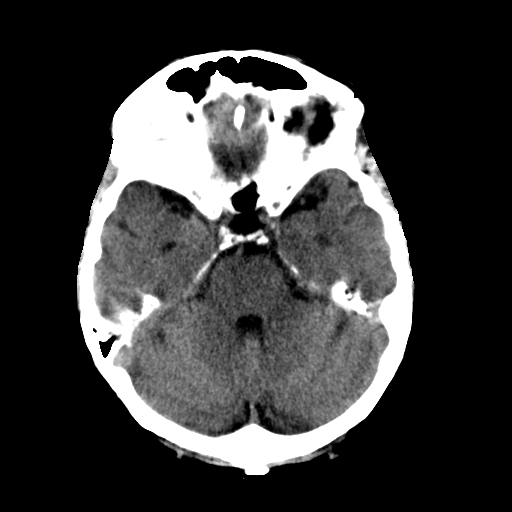
[im 9/30  brain]
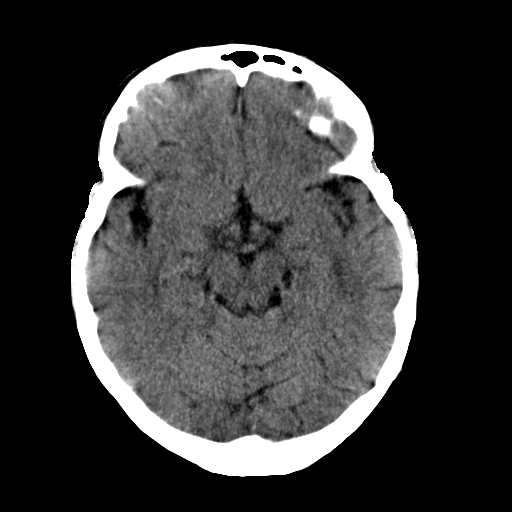
[im 11/30  brain]
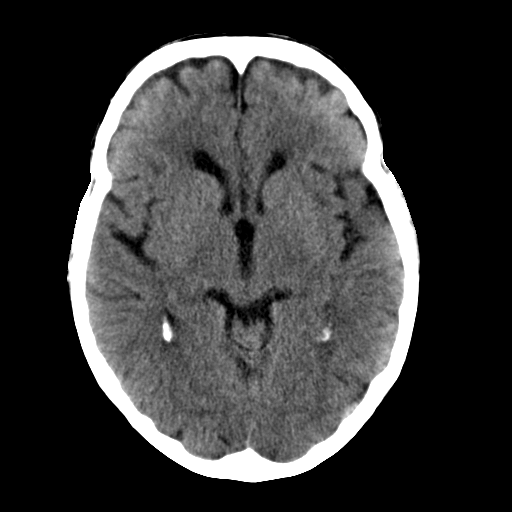
[im 11/30  bone]
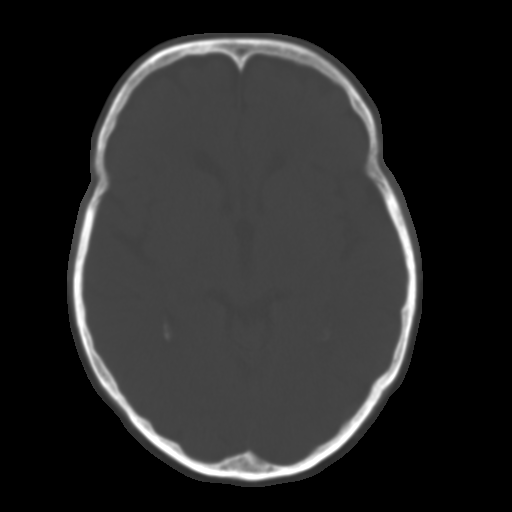
[im 13/30  brain]
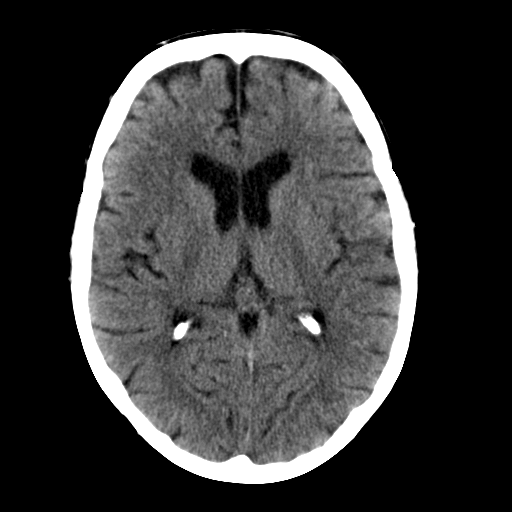
[im 15/30  brain]
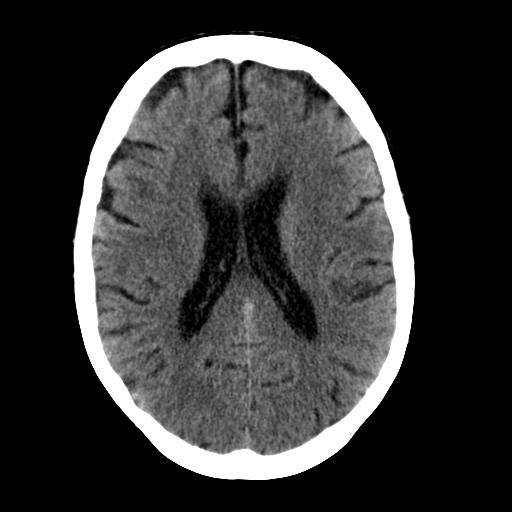
[im 17/30  brain]
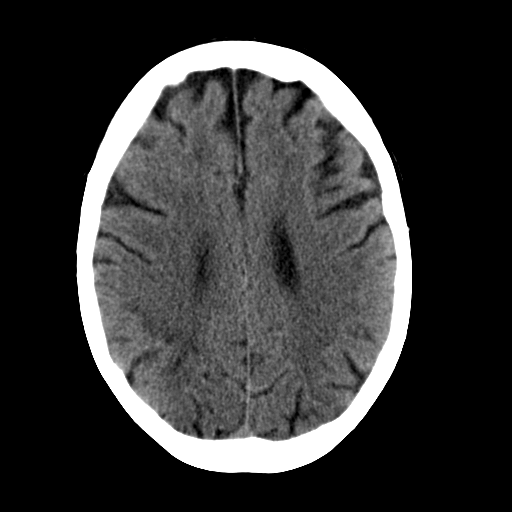
[im 19/30  brain]
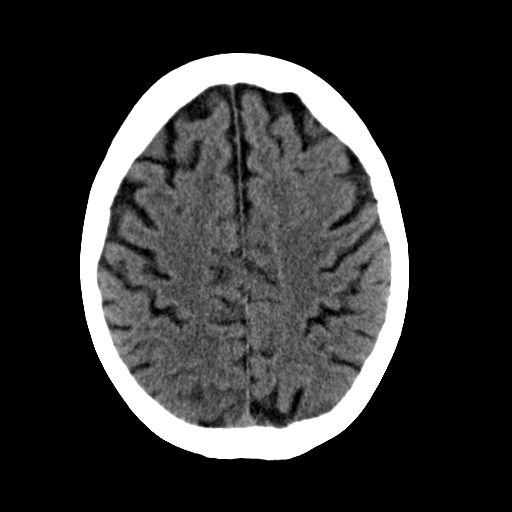
[im 19/30  bone]
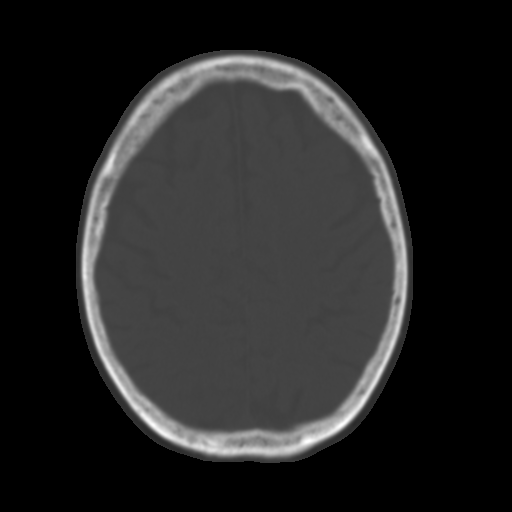
[im 21/30  brain]
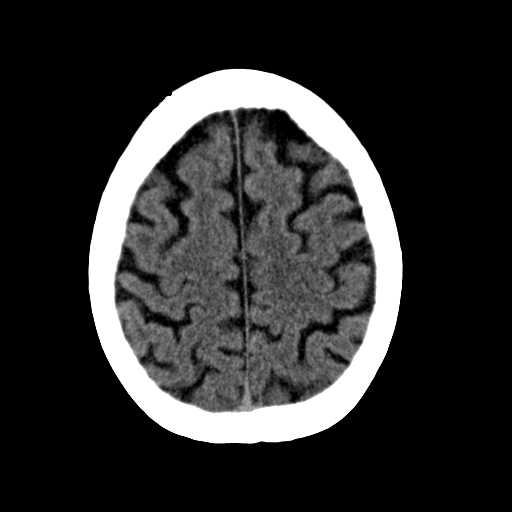
[im 23/30  brain]
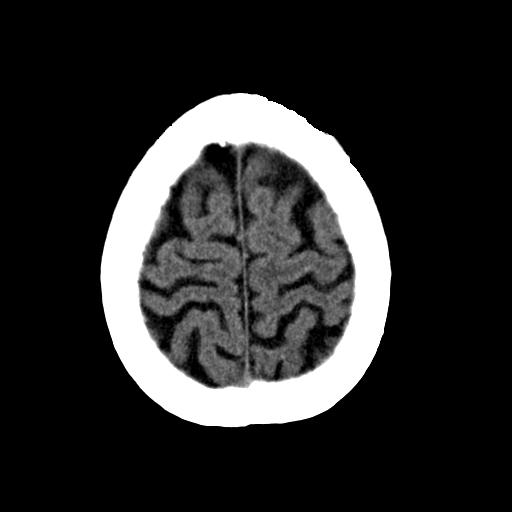
[im 25/30  brain]
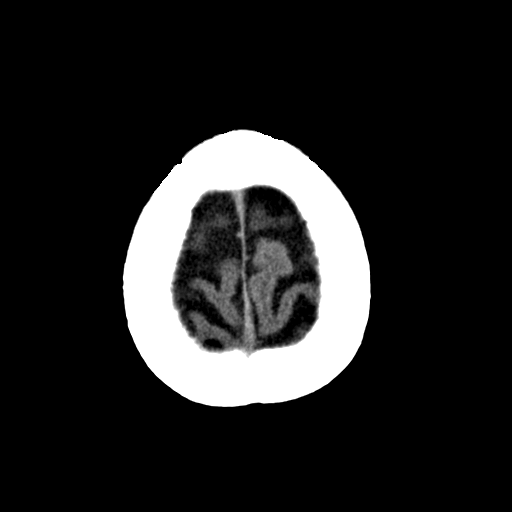
[im 27/30  brain]
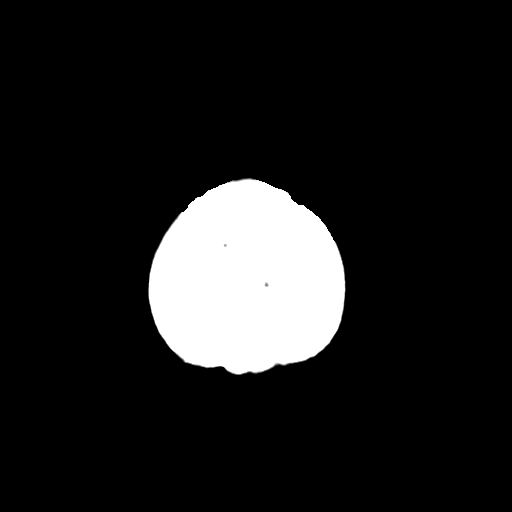
[im 27/30  bone]
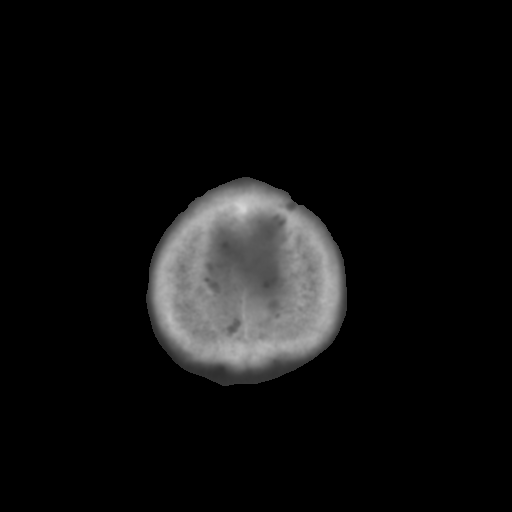

[13 of 30 positions shown; findings below may reference images not displayed]

FINDINGS: Mild atrophy and white matter disease is advanced for age. There is
no focal enhancement to suggest metastatic disease of the brain or
meninges. A prominent vein is present in the medial right frontal
lobe, likely a DVA.

No acute hemorrhage or mass lesion is present. There is no evidence
for acute infarct.

The ventricles are of normal size. No significant extra-axial fluid
collection is present.

The calvarium is intact. The paranasal sinuses and mastoid air cells
are clear.
IMPRESSION: 1. Mild atrophy and white matter disease is somewhat advanced for
age. This likely reflects the sequela of chronic microvascular
ischemia.
2. No enhancement to suggest metastatic disease.

## 2016-03-01 DEATH — deceased

## 2016-06-02 ENCOUNTER — Other Ambulatory Visit: Payer: Self-pay | Admitting: Nurse Practitioner
